# Patient Record
Sex: Male | Born: 1945
Health system: Southern US, Community
[De-identification: ages and names within clinical notes are randomized; demographics above are authoritative.]

## PROBLEM LIST (undated history)

## (undated) DIAGNOSIS — E039 Hypothyroidism, unspecified: Secondary | ICD-10-CM

## (undated) DIAGNOSIS — I10 Essential (primary) hypertension: Secondary | ICD-10-CM

## (undated) DIAGNOSIS — M1990 Unspecified osteoarthritis, unspecified site: Secondary | ICD-10-CM

## (undated) DIAGNOSIS — C801 Malignant (primary) neoplasm, unspecified: Secondary | ICD-10-CM

## (undated) DIAGNOSIS — E785 Hyperlipidemia, unspecified: Secondary | ICD-10-CM

## (undated) DIAGNOSIS — N200 Calculus of kidney: Secondary | ICD-10-CM

## (undated) HISTORY — DX: Hyperlipidemia, unspecified: E78.5

## (undated) HISTORY — PX: NO PAST SURGERIES: SHX2092

## (undated) HISTORY — DX: Calculus of kidney: N20.0

---

## 2007-06-16 ENCOUNTER — Other Ambulatory Visit: Payer: Self-pay

## 2007-06-16 ENCOUNTER — Emergency Department: Payer: Self-pay | Admitting: Emergency Medicine

## 2008-07-02 DIAGNOSIS — Z23 Encounter for immunization: Secondary | ICD-10-CM | POA: Insufficient documentation

## 2010-11-22 LAB — HM COLONOSCOPY

## 2011-12-13 DIAGNOSIS — Z85820 Personal history of malignant melanoma of skin: Secondary | ICD-10-CM | POA: Diagnosis not present

## 2012-05-14 DIAGNOSIS — L821 Other seborrheic keratosis: Secondary | ICD-10-CM | POA: Diagnosis not present

## 2012-05-14 DIAGNOSIS — Z85820 Personal history of malignant melanoma of skin: Secondary | ICD-10-CM | POA: Diagnosis not present

## 2012-06-16 DIAGNOSIS — H524 Presbyopia: Secondary | ICD-10-CM | POA: Diagnosis not present

## 2012-06-16 DIAGNOSIS — I10 Essential (primary) hypertension: Secondary | ICD-10-CM | POA: Diagnosis not present

## 2012-06-16 DIAGNOSIS — H5200 Hypermetropia, unspecified eye: Secondary | ICD-10-CM | POA: Diagnosis not present

## 2012-06-16 DIAGNOSIS — H43819 Vitreous degeneration, unspecified eye: Secondary | ICD-10-CM | POA: Diagnosis not present

## 2012-07-24 DIAGNOSIS — H5200 Hypermetropia, unspecified eye: Secondary | ICD-10-CM | POA: Diagnosis not present

## 2012-07-24 DIAGNOSIS — H524 Presbyopia: Secondary | ICD-10-CM | POA: Diagnosis not present

## 2012-07-24 DIAGNOSIS — H43819 Vitreous degeneration, unspecified eye: Secondary | ICD-10-CM | POA: Diagnosis not present

## 2012-07-24 DIAGNOSIS — E7800 Pure hypercholesterolemia, unspecified: Secondary | ICD-10-CM | POA: Diagnosis not present

## 2012-08-05 DIAGNOSIS — M7040 Prepatellar bursitis, unspecified knee: Secondary | ICD-10-CM | POA: Diagnosis not present

## 2012-09-02 ENCOUNTER — Emergency Department: Payer: Self-pay | Admitting: Emergency Medicine

## 2012-09-02 DIAGNOSIS — Z79899 Other long term (current) drug therapy: Secondary | ICD-10-CM | POA: Diagnosis not present

## 2012-09-02 DIAGNOSIS — S60219A Contusion of unspecified wrist, initial encounter: Secondary | ICD-10-CM | POA: Diagnosis not present

## 2012-09-02 DIAGNOSIS — S63509A Unspecified sprain of unspecified wrist, initial encounter: Secondary | ICD-10-CM | POA: Diagnosis not present

## 2012-09-02 DIAGNOSIS — I10 Essential (primary) hypertension: Secondary | ICD-10-CM | POA: Diagnosis not present

## 2012-09-02 DIAGNOSIS — T148XXA Other injury of unspecified body region, initial encounter: Secondary | ICD-10-CM | POA: Diagnosis not present

## 2012-09-02 DIAGNOSIS — M7989 Other specified soft tissue disorders: Secondary | ICD-10-CM | POA: Diagnosis not present

## 2012-09-02 DIAGNOSIS — M79609 Pain in unspecified limb: Secondary | ICD-10-CM | POA: Diagnosis not present

## 2012-09-12 DIAGNOSIS — Z85820 Personal history of malignant melanoma of skin: Secondary | ICD-10-CM | POA: Diagnosis not present

## 2012-09-12 DIAGNOSIS — D235 Other benign neoplasm of skin of trunk: Secondary | ICD-10-CM | POA: Diagnosis not present

## 2012-09-12 DIAGNOSIS — L821 Other seborrheic keratosis: Secondary | ICD-10-CM | POA: Diagnosis not present

## 2012-09-16 DIAGNOSIS — M7040 Prepatellar bursitis, unspecified knee: Secondary | ICD-10-CM | POA: Diagnosis not present

## 2012-09-17 DIAGNOSIS — T1500XA Foreign body in cornea, unspecified eye, initial encounter: Secondary | ICD-10-CM | POA: Diagnosis not present

## 2012-09-17 DIAGNOSIS — H5200 Hypermetropia, unspecified eye: Secondary | ICD-10-CM | POA: Diagnosis not present

## 2012-09-17 DIAGNOSIS — H16109 Unspecified superficial keratitis, unspecified eye: Secondary | ICD-10-CM | POA: Diagnosis not present

## 2012-09-17 DIAGNOSIS — H524 Presbyopia: Secondary | ICD-10-CM | POA: Diagnosis not present

## 2012-09-19 DIAGNOSIS — T1500XA Foreign body in cornea, unspecified eye, initial encounter: Secondary | ICD-10-CM | POA: Diagnosis not present

## 2012-09-19 DIAGNOSIS — H524 Presbyopia: Secondary | ICD-10-CM | POA: Diagnosis not present

## 2012-09-19 DIAGNOSIS — H5200 Hypermetropia, unspecified eye: Secondary | ICD-10-CM | POA: Diagnosis not present

## 2012-09-19 DIAGNOSIS — E7800 Pure hypercholesterolemia, unspecified: Secondary | ICD-10-CM | POA: Diagnosis not present

## 2012-09-19 DIAGNOSIS — Z23 Encounter for immunization: Secondary | ICD-10-CM | POA: Diagnosis not present

## 2012-11-17 DIAGNOSIS — L299 Pruritus, unspecified: Secondary | ICD-10-CM | POA: Diagnosis not present

## 2012-11-17 DIAGNOSIS — T148 Other injury of unspecified body region: Secondary | ICD-10-CM | POA: Diagnosis not present

## 2012-11-17 DIAGNOSIS — R229 Localized swelling, mass and lump, unspecified: Secondary | ICD-10-CM | POA: Diagnosis not present

## 2012-11-17 DIAGNOSIS — L03119 Cellulitis of unspecified part of limb: Secondary | ICD-10-CM | POA: Diagnosis not present

## 2012-11-17 DIAGNOSIS — L0390 Cellulitis, unspecified: Secondary | ICD-10-CM | POA: Diagnosis not present

## 2012-11-17 DIAGNOSIS — L0291 Cutaneous abscess, unspecified: Secondary | ICD-10-CM | POA: Diagnosis not present

## 2012-11-17 DIAGNOSIS — R21 Rash and other nonspecific skin eruption: Secondary | ICD-10-CM | POA: Diagnosis not present

## 2012-11-17 DIAGNOSIS — IMO0001 Reserved for inherently not codable concepts without codable children: Secondary | ICD-10-CM | POA: Diagnosis not present

## 2012-12-11 DIAGNOSIS — E785 Hyperlipidemia, unspecified: Secondary | ICD-10-CM | POA: Diagnosis not present

## 2012-12-11 DIAGNOSIS — I10 Essential (primary) hypertension: Secondary | ICD-10-CM | POA: Diagnosis not present

## 2012-12-11 DIAGNOSIS — Z1159 Encounter for screening for other viral diseases: Secondary | ICD-10-CM | POA: Diagnosis not present

## 2012-12-11 DIAGNOSIS — Z0000 Encounter for general adult medical examination without abnormal findings: Secondary | ICD-10-CM | POA: Diagnosis not present

## 2013-01-22 IMAGING — US US EXTREM UP VENOUS*R*
1 series · 14 of 24 positions shown · non-contrast
Comparison: none

REASON FOR EXAM: swelling and discoloration in right upper extremity
COMMENTS:

[Series 1: us extrem up venous*right* · 0.09mm/px · 14 of 47 slices shown]
[im 1/47]
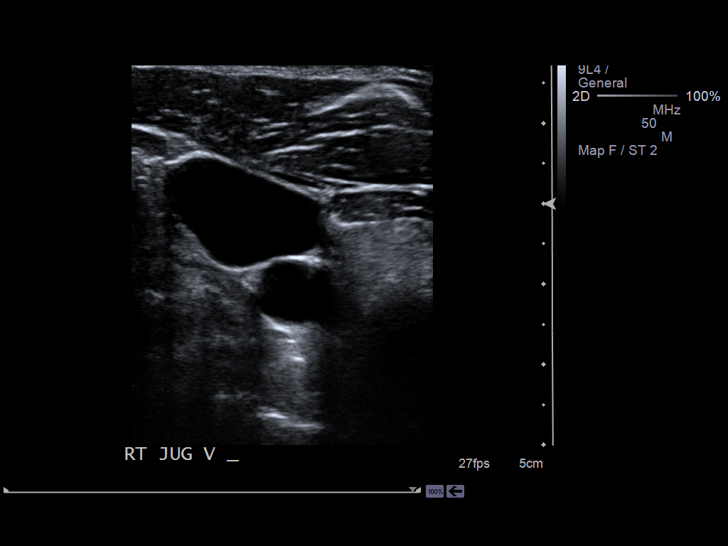
[im 5/47]
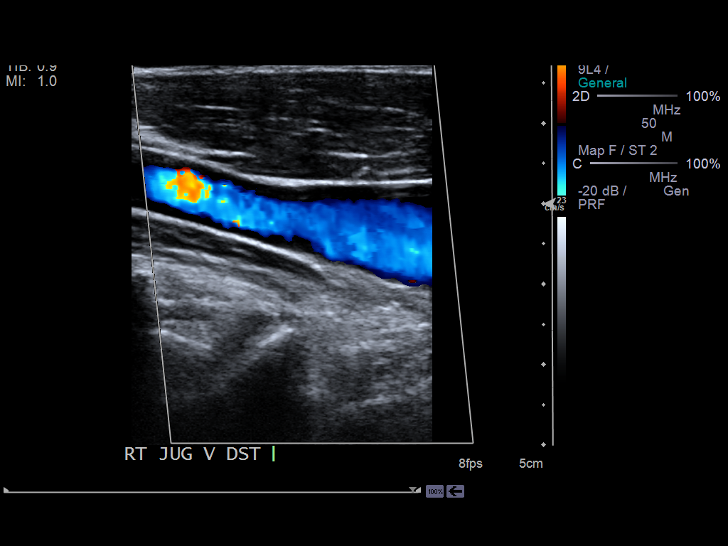
[im 9/47]
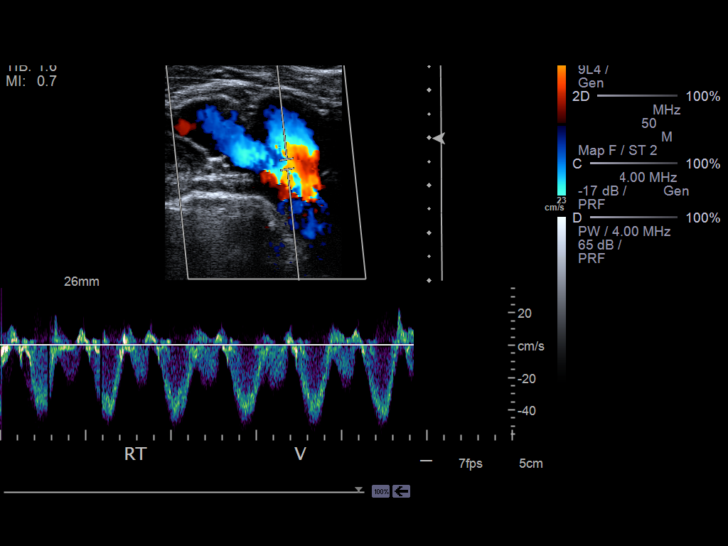
[im 13/47]
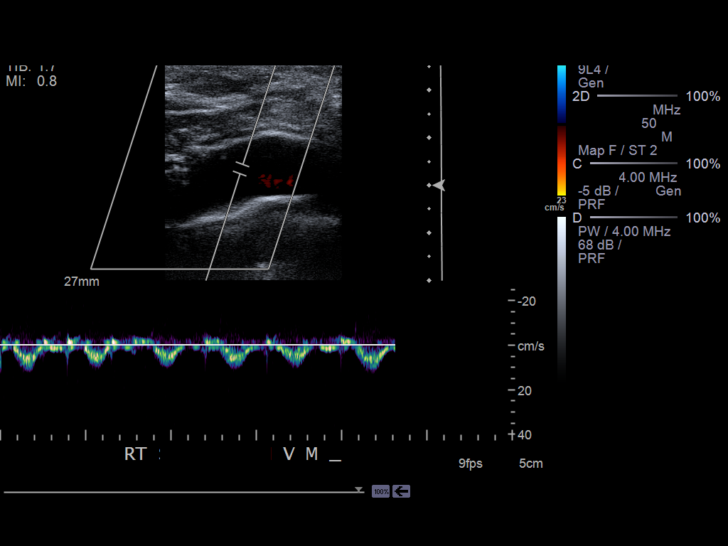
[im 15/47]
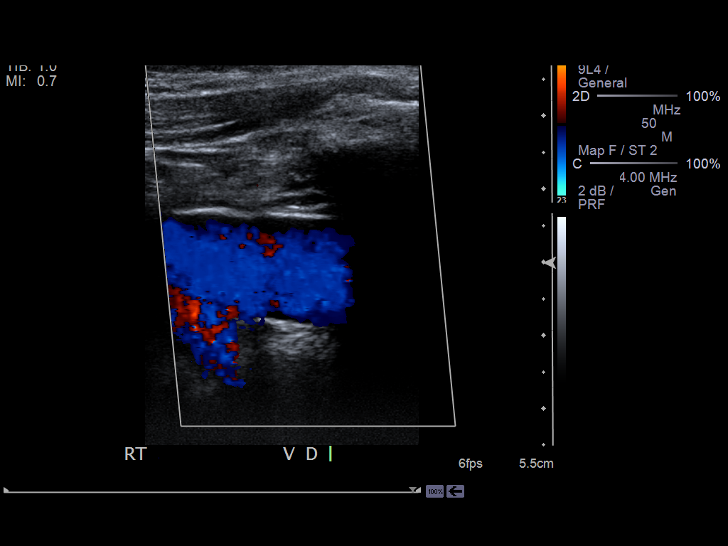
[im 19/47]
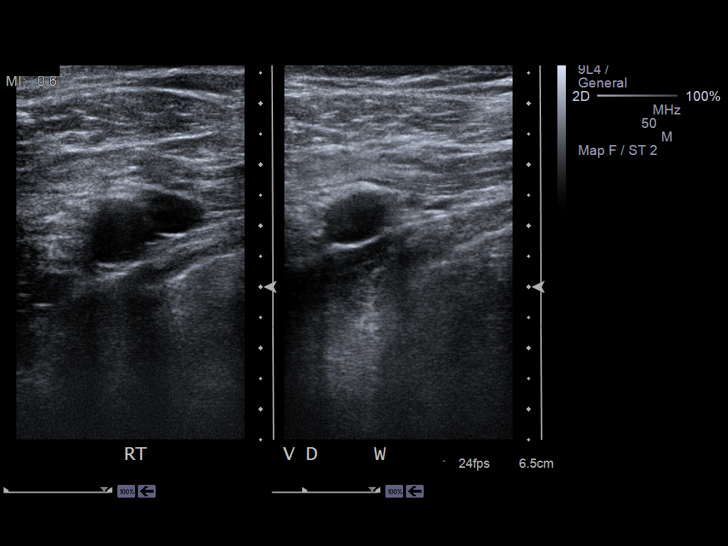
[im 23/47]
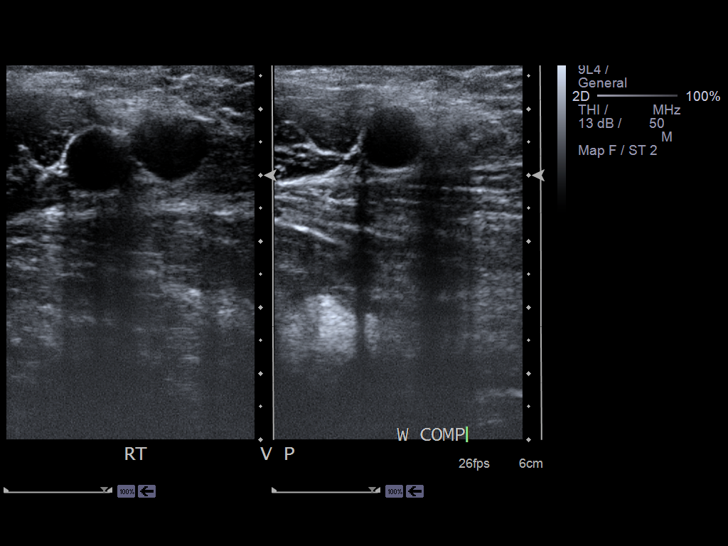
[im 25/47]
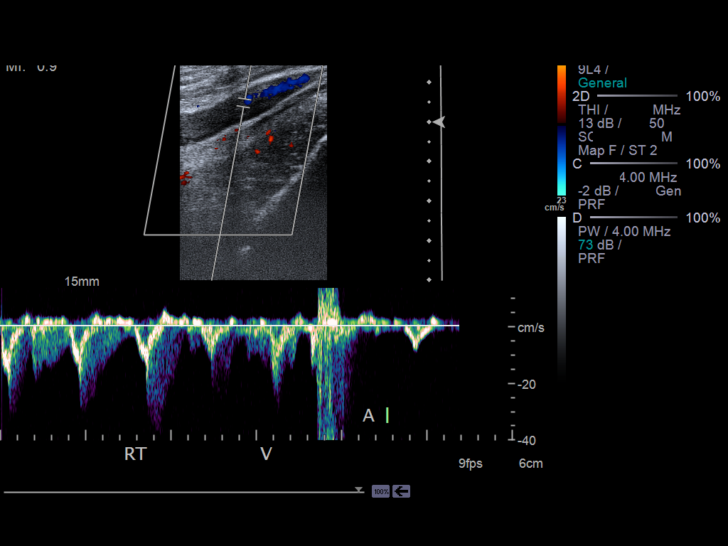
[im 29/47]
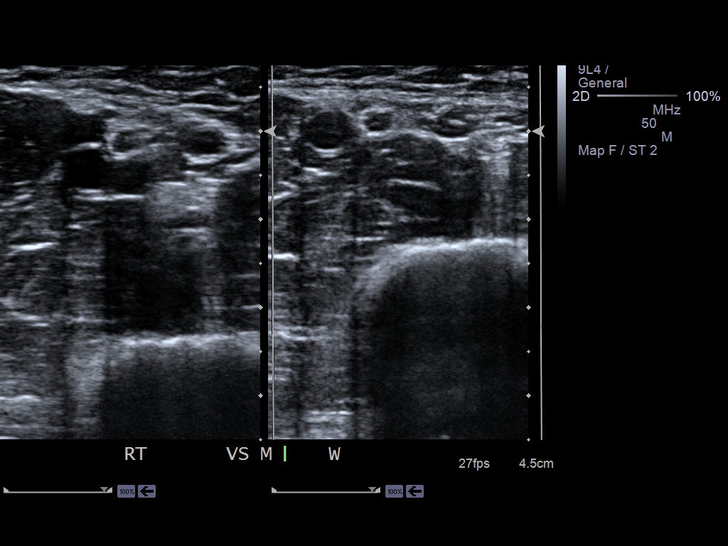
[im 33/47]
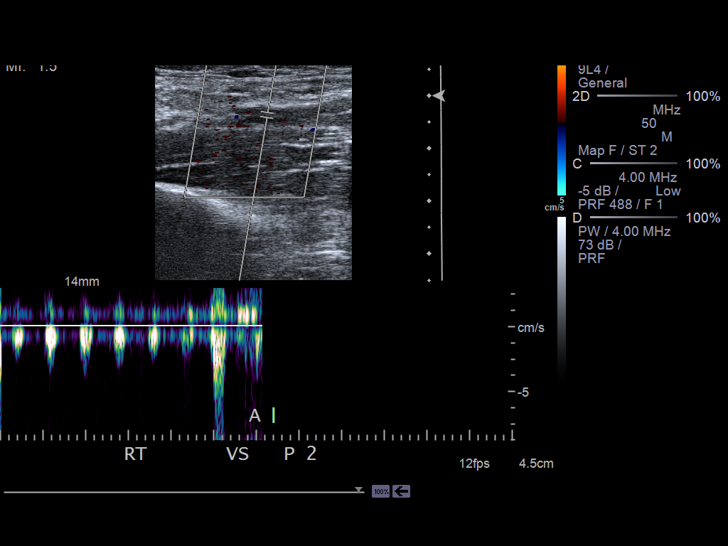
[im 37/47]
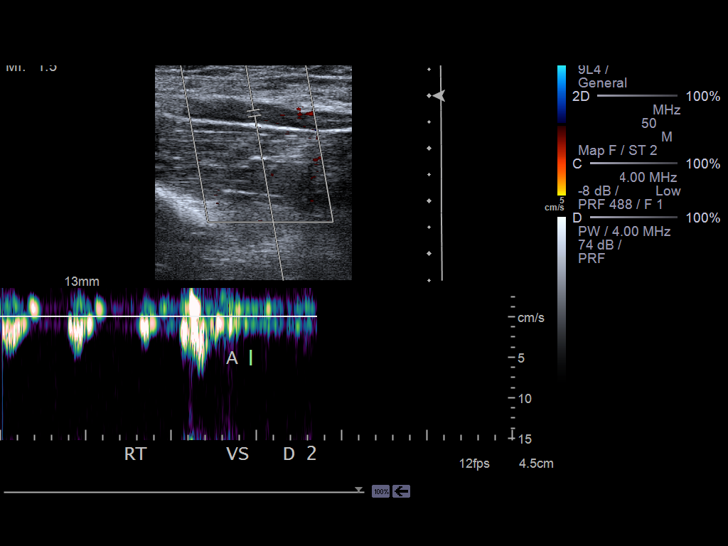
[im 39/47]
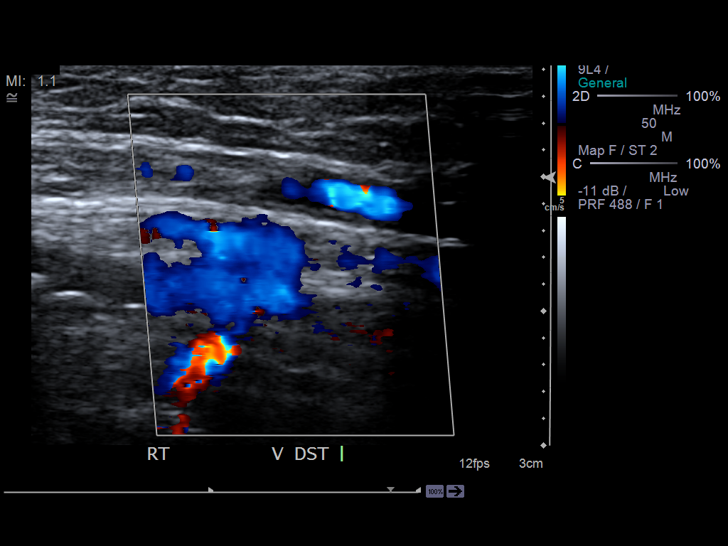
[im 43/47]
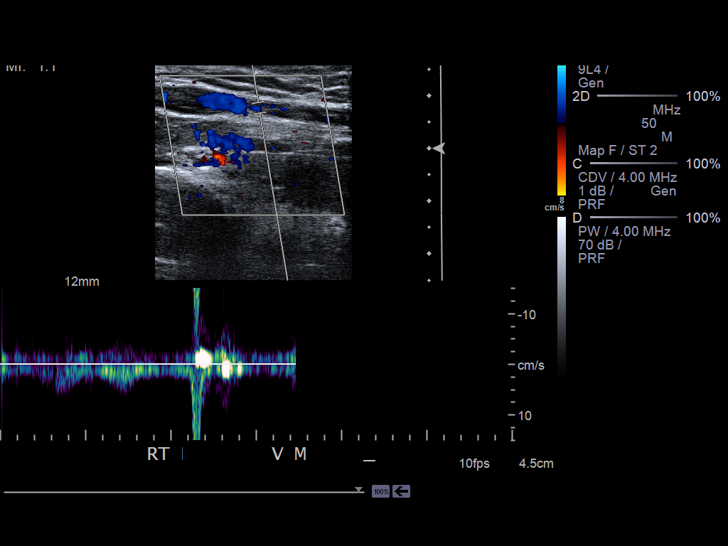
[im 47/47]
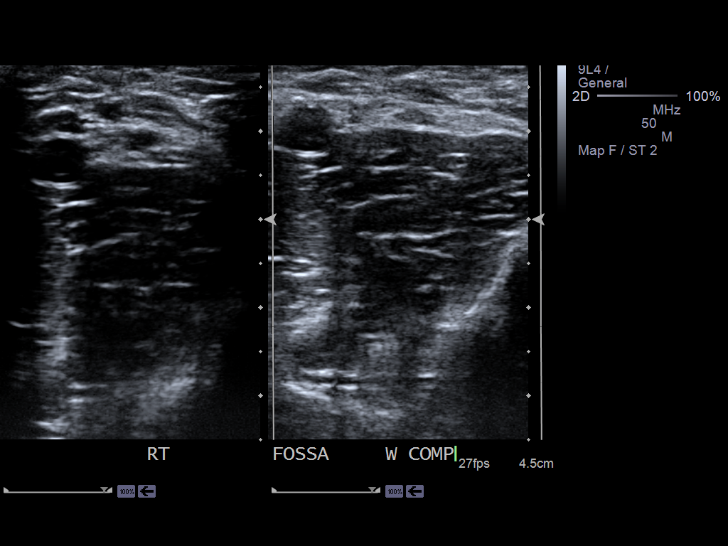

[14 of 24 positions shown; findings below may reference images not displayed]

PROCEDURE:     US  - US DOPPLER UP EXTR RIGHT  - September 02, 2012  [DATE]

RESULT:     Technique: Gray scale, Duplex color flow and SPECTRAL waveform
imaging was performed of the deep venous structures of the right upper
extremity.

There is no evidence of increased echogenicity, non- compressibility,
abnormal waveform or abnormal color filling within the interrogated deep
venous structures of the RIGHT upper extremity. There is appropriate
response to manipulation within the interrogated vessels.
IMPRESSION: 1. No sonographic evidence of a deep venous thrombus within the interrogated
vessels of the RIGHT upper extremity.

## 2013-02-10 DIAGNOSIS — Z23 Encounter for immunization: Secondary | ICD-10-CM | POA: Diagnosis not present

## 2013-02-10 DIAGNOSIS — N4889 Other specified disorders of penis: Secondary | ICD-10-CM | POA: Diagnosis not present

## 2013-03-18 DIAGNOSIS — Z85820 Personal history of malignant melanoma of skin: Secondary | ICD-10-CM | POA: Diagnosis not present

## 2013-03-18 DIAGNOSIS — D235 Other benign neoplasm of skin of trunk: Secondary | ICD-10-CM | POA: Diagnosis not present

## 2013-03-18 DIAGNOSIS — L570 Actinic keratosis: Secondary | ICD-10-CM | POA: Diagnosis not present

## 2013-06-15 DIAGNOSIS — M7040 Prepatellar bursitis, unspecified knee: Secondary | ICD-10-CM | POA: Diagnosis not present

## 2013-06-15 DIAGNOSIS — M1710 Unilateral primary osteoarthritis, unspecified knee: Secondary | ICD-10-CM | POA: Diagnosis not present

## 2013-06-22 DIAGNOSIS — H524 Presbyopia: Secondary | ICD-10-CM | POA: Diagnosis not present

## 2013-06-22 DIAGNOSIS — H21309 Idiopathic cysts of iris, ciliary body or anterior chamber, unspecified eye: Secondary | ICD-10-CM | POA: Diagnosis not present

## 2013-06-22 DIAGNOSIS — H11159 Pinguecula, unspecified eye: Secondary | ICD-10-CM | POA: Diagnosis not present

## 2013-06-22 DIAGNOSIS — H5200 Hypermetropia, unspecified eye: Secondary | ICD-10-CM | POA: Diagnosis not present

## 2013-09-17 DIAGNOSIS — L821 Other seborrheic keratosis: Secondary | ICD-10-CM | POA: Diagnosis not present

## 2013-09-17 DIAGNOSIS — D235 Other benign neoplasm of skin of trunk: Secondary | ICD-10-CM | POA: Diagnosis not present

## 2013-09-17 DIAGNOSIS — Z85820 Personal history of malignant melanoma of skin: Secondary | ICD-10-CM | POA: Diagnosis not present

## 2013-12-10 DIAGNOSIS — H43399 Other vitreous opacities, unspecified eye: Secondary | ICD-10-CM | POA: Diagnosis not present

## 2013-12-14 DIAGNOSIS — I10 Essential (primary) hypertension: Secondary | ICD-10-CM | POA: Diagnosis not present

## 2013-12-14 DIAGNOSIS — Z136 Encounter for screening for cardiovascular disorders: Secondary | ICD-10-CM | POA: Diagnosis not present

## 2013-12-14 DIAGNOSIS — Z125 Encounter for screening for malignant neoplasm of prostate: Secondary | ICD-10-CM | POA: Diagnosis not present

## 2013-12-14 DIAGNOSIS — Z0000 Encounter for general adult medical examination without abnormal findings: Secondary | ICD-10-CM | POA: Diagnosis not present

## 2014-06-17 DIAGNOSIS — D2272 Melanocytic nevi of left lower limb, including hip: Secondary | ICD-10-CM | POA: Diagnosis not present

## 2014-06-17 DIAGNOSIS — D2262 Melanocytic nevi of left upper limb, including shoulder: Secondary | ICD-10-CM | POA: Diagnosis not present

## 2014-06-17 DIAGNOSIS — L821 Other seborrheic keratosis: Secondary | ICD-10-CM | POA: Diagnosis not present

## 2014-06-17 DIAGNOSIS — D2261 Melanocytic nevi of right upper limb, including shoulder: Secondary | ICD-10-CM | POA: Diagnosis not present

## 2014-06-29 DIAGNOSIS — H524 Presbyopia: Secondary | ICD-10-CM | POA: Diagnosis not present

## 2014-06-29 DIAGNOSIS — H5203 Hypermetropia, bilateral: Secondary | ICD-10-CM | POA: Diagnosis not present

## 2014-06-29 DIAGNOSIS — H25093 Other age-related incipient cataract, bilateral: Secondary | ICD-10-CM | POA: Diagnosis not present

## 2014-06-29 DIAGNOSIS — H52221 Regular astigmatism, right eye: Secondary | ICD-10-CM | POA: Diagnosis not present

## 2014-12-17 DIAGNOSIS — Z0000 Encounter for general adult medical examination without abnormal findings: Secondary | ICD-10-CM | POA: Diagnosis not present

## 2014-12-17 DIAGNOSIS — Z125 Encounter for screening for malignant neoplasm of prostate: Secondary | ICD-10-CM | POA: Diagnosis not present

## 2014-12-17 DIAGNOSIS — Z1389 Encounter for screening for other disorder: Secondary | ICD-10-CM | POA: Diagnosis not present

## 2015-01-04 DIAGNOSIS — I10 Essential (primary) hypertension: Secondary | ICD-10-CM | POA: Diagnosis not present

## 2015-01-04 DIAGNOSIS — Z125 Encounter for screening for malignant neoplasm of prostate: Secondary | ICD-10-CM | POA: Diagnosis not present

## 2015-01-04 LAB — HEPATIC FUNCTION PANEL
ALT: 17 U/L (ref 10–40)
AST: 24 U/L (ref 14–40)
Alkaline Phosphatase: 53 U/L (ref 25–125)
BILIRUBIN, TOTAL: 0.4 mg/dL

## 2015-01-04 LAB — LIPID PANEL
Cholesterol: 171 mg/dL (ref 0–200)
HDL: 42 mg/dL (ref 35–70)
LDL Cholesterol: 115 mg/dL
TRIGLYCERIDES: 72 mg/dL (ref 40–160)

## 2015-01-04 LAB — CBC AND DIFFERENTIAL
HCT: 40 % — AB (ref 41–53)
Hemoglobin: 13.3 g/dL — AB (ref 13.5–17.5)
Neutrophils Absolute: 2 /uL
Platelets: 281 10*3/uL (ref 150–399)
WBC: 4.6 10^3/mL

## 2015-01-04 LAB — BASIC METABOLIC PANEL
BUN: 15 mg/dL (ref 4–21)
CREATININE: 1 mg/dL (ref 0.6–1.3)
Glucose: 93 mg/dL
POTASSIUM: 4 mmol/L (ref 3.4–5.3)
SODIUM: 140 mmol/L (ref 137–147)

## 2015-01-04 LAB — PSA: PSA: 1.9

## 2015-06-16 DIAGNOSIS — Z85820 Personal history of malignant melanoma of skin: Secondary | ICD-10-CM | POA: Diagnosis not present

## 2015-06-16 DIAGNOSIS — D485 Neoplasm of uncertain behavior of skin: Secondary | ICD-10-CM | POA: Diagnosis not present

## 2015-06-16 DIAGNOSIS — D2262 Melanocytic nevi of left upper limb, including shoulder: Secondary | ICD-10-CM | POA: Diagnosis not present

## 2015-06-16 DIAGNOSIS — D2271 Melanocytic nevi of right lower limb, including hip: Secondary | ICD-10-CM | POA: Diagnosis not present

## 2015-06-16 DIAGNOSIS — X32XXXA Exposure to sunlight, initial encounter: Secondary | ICD-10-CM | POA: Diagnosis not present

## 2015-06-16 DIAGNOSIS — L570 Actinic keratosis: Secondary | ICD-10-CM | POA: Diagnosis not present

## 2015-08-09 DIAGNOSIS — Z23 Encounter for immunization: Secondary | ICD-10-CM | POA: Diagnosis not present

## 2015-09-07 DIAGNOSIS — M1711 Unilateral primary osteoarthritis, right knee: Secondary | ICD-10-CM | POA: Diagnosis not present

## 2015-11-03 ENCOUNTER — Telehealth: Payer: Self-pay | Admitting: Family Medicine

## 2015-11-03 MED ORDER — AMLODIPINE BESYLATE 5 MG PO TABS: 5.0000 mg | ORAL_TABLET | Freq: Every day | ORAL | Status: DC

## 2015-11-03 NOTE — Telephone Encounter (Signed)
Pt contacted office for refill request on the following medications: Amlodipine Besylate 5 mg to [facility] [address]  Pt is scheduled for CPE on 12/19/15 and last OV was CPE on 12/17/14. Thanks TNP

## 2015-11-03 NOTE — Telephone Encounter (Signed)
Refill sent to [facility]. [employer].

## 2015-11-03 NOTE — Telephone Encounter (Signed)
Please review. Thanks!  

## 2015-11-23 DIAGNOSIS — Z2821 Immunization not carried out because of patient refusal: Secondary | ICD-10-CM | POA: Insufficient documentation

## 2015-11-23 DIAGNOSIS — N489 Disorder of penis, unspecified: Secondary | ICD-10-CM | POA: Insufficient documentation

## 2015-12-19 ENCOUNTER — Ambulatory Visit (INDEPENDENT_AMBULATORY_CARE_PROVIDER_SITE_OTHER): Payer: Medicare Other | Admitting: Family Medicine

## 2015-12-19 ENCOUNTER — Encounter: Payer: Self-pay | Admitting: Family Medicine

## 2015-12-19 VITALS — BP 118/80 | HR 62 | Temp 97.8°F | Resp 14 | Ht 69.5 in | Wt 177.4 lb

## 2015-12-19 DIAGNOSIS — E785 Hyperlipidemia, unspecified: Secondary | ICD-10-CM | POA: Diagnosis not present

## 2015-12-19 DIAGNOSIS — N400 Enlarged prostate without lower urinary tract symptoms: Secondary | ICD-10-CM

## 2015-12-19 DIAGNOSIS — Z2821 Immunization not carried out because of patient refusal: Secondary | ICD-10-CM | POA: Diagnosis not present

## 2015-12-19 DIAGNOSIS — I10 Essential (primary) hypertension: Secondary | ICD-10-CM

## 2015-12-19 DIAGNOSIS — Z0000 Encounter for general adult medical examination without abnormal findings: Secondary | ICD-10-CM

## 2015-12-19 LAB — POCT URINALYSIS DIPSTICK
Bilirubin, UA: NEGATIVE
Blood, UA: NEGATIVE
GLUCOSE UA: NEGATIVE
KETONES UA: NEGATIVE
LEUKOCYTES UA: NEGATIVE
Nitrite, UA: NEGATIVE
PROTEIN UA: NEGATIVE
Spec Grav, UA: 1.015
Urobilinogen, UA: 0.2
pH, UA: 7

## 2015-12-19 NOTE — Progress Notes (Signed)
Patient ID: Joseph Whitaker, male   DOB: 22-Apr-1946, 70 y.o.   MRN: [id number] Patient: Joseph Whitaker, Male    DOB: February 04, 1946, 70 y.o.   MRN: [id number] Visit Date: 12/19/2015  Today's Provider: Vernie Murders, PA   Chief Complaint  Patient presents with  . Annual Exam   Subjective:    Annual wellness visit Joseph Whitaker is a 70 y.o. male who presents today for his Subsequent Annual Wellness Visit. He feels well. 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 He reports he is sleeping average 5-6 hours per night.  -----------------------------------------------------------  Tdap: 04/04/2009 Zoster: 07/02/2008 Prevnar: declined Pneumovax: declined Colonoscopy: 11/22/2010  Review of Systems  Constitutional: Negative.   HENT: Negative.   Eyes: Negative.   Respiratory: Negative.   Cardiovascular: Negative.   Gastrointestinal: Negative.   Endocrine: Negative.   Genitourinary: Negative.   Musculoskeletal: Negative.   Skin: Negative.   Allergic/Immunologic: Negative.   Neurological: Negative.   Hematological: Negative.   Psychiatric/Behavioral: Negative.     Social History   Social History  . Marital Status: Married    Spouse Name: N/A  . Number of Children: 4  . Years of Education: N/A   Occupational History  . [employer] History Main Topics  . Smoking status: Former Smoker    Quit date: 09/03/1985  . Smokeless tobacco: Not on file  . Alcohol Use: 0.6 oz/week    0 Standard drinks or equivalent, 1 Cans of beer per week     Comment: occasionally  . Drug Use: No  . Sexual Activity: Not on file   Other Topics Concern  . Not on file   Social History Narrative    Patient Active Problem List   Diagnosis Date Noted  . Lesion of penis 11/23/2015  . Pneumococcal vaccination declined 11/23/2015  . Cannot sleep 12/29/2009  . Excessive urination at night 07/02/2008  . Need for vaccination 07/02/2008  . CD (contact dermatitis) 01/19/2008  . Essential  (primary) hypertension 06/16/2007  . HLD (hyperlipidemia) 08/10/2003    Past Surgical History  Procedure Laterality Date  . No past surgeries      His family history includes Arthritis in his brother; Colon cancer in his paternal grandfather; Dementia in his father; Heart attack in his brother; Heart disease in his maternal grandfather and maternal grandmother; Hyperlipidemia in his brother, brother, and sister.    Previous Medications   AMLODIPINE (NORVASC) 5 MG TABLET    Take 1 tablet (5 mg total) by mouth daily.   MULTIPLE VITAMIN PO    Take by mouth.   RED YEAST RICE 600 MG TABS    Take by mouth.    Patient Care Team: Margo Common, PA as PCP - General (Family Medicine)     Objective:   Vitals: BP 118/80 mmHg  Pulse 62  Temp(Src) 97.8 F (36.6 C) (Oral)  Resp 14  Ht 5' 9.5" (1.765 m)  Wt 177 lb 6.4 oz (80.468 kg)  BMI 25.83 kg/m2  Physical Exam  Constitutional: He is oriented to person, place, and time. He appears well-developed and well-nourished.  HENT:  Head: Normocephalic and atraumatic.  Right Ear: External ear normal.  Left Ear: External ear normal.  Nose: Nose normal.  Mouth/Throat: Oropharynx is clear and moist.  Eyes: Conjunctivae and EOM are normal. Pupils are equal, round, and reactive to light. Right eye exhibits no discharge.  Neck: Normal range of motion. Neck supple. No tracheal deviation present. No thyromegaly present.  Cardiovascular:  Normal rate, regular rhythm, normal heart sounds and intact distal pulses.   No murmur heard. Pulmonary/Chest: Effort normal and breath sounds normal. No respiratory distress. He has no wheezes. He has no rales. He exhibits no tenderness.  Abdominal: Soft. He exhibits no distension and no mass. There is no tenderness. There is no rebound and no guarding.  Genitourinary: Penis normal. Guaiac negative stool.  Prostate enlarged 3+. No specific nodules.  Musculoskeletal: Normal range of motion. He exhibits no edema  or tenderness.  Slight crepitus in the right knee to test ROM. Followed by orthopedist - postponing surgical intervention.  Lymphadenopathy:    He has no cervical adenopathy.  Neurological: He is alert and oriented to person, place, and time. He has normal reflexes. No cranial nerve deficit. He exhibits normal muscle tone. Coordination normal.  Skin: Skin is warm and dry. No rash noted. No erythema.  Psychiatric: He has a normal mood and affect. His behavior is normal. Judgment and thought content normal.    Activities of Daily Living In your present state of health, do you have any difficulty performing the following activities: 12/19/2015  Hearing? N  Vision? N  Difficulty concentrating or making decisions? N  Walking or climbing stairs? N  Dressing or bathing? N  Doing errands, shopping? N    Fall Risk Assessment Fall Risk  12/19/2015  Falls in the past year? No     Depression Screen PHQ 2/9 Scores 12/19/2015  PHQ - 2 Score 0    Cognitive Testing - 6-CIT  Correct? Score   What year is it? yes 0 0 or 4  What month is it? yes 0 0 or 3  Memorize:    Joseph Whitaker,  43,  [address],  [place],      What time is it? (within 1 hour) yes 0 0 or 3  Count backwards from 20 yes 0 0, 2, or 4  Name the months of the year yes 0 0, 2, or 4  Repeat name & address above yes 0 0, 2, 4, 6, 8, or 10       TOTAL SCORE  0/28   Interpretation:  Normal  Normal (0-7) Abnormal (8-28)    Assessment & Plan:     Annual Wellness Visit  Reviewed patient's Family Medical History Reviewed and updated list of patient's medical providers Assessment of cognitive impairment was done Assessed patient's functional ability Established a written schedule for health screening [place] Completed and Reviewed  Exercise Activities and Dietary recommendations Goals    Very physically active working the farm.      Immunization History  Administered Date(s) Administered  . Tdap  04/04/2009  . Zoster 07/02/2008    Health Maintenance  Topic Date Due  . Hepatitis C Screening  [birth year]  . PNA vac Low Risk Adult (1 of 2 - PCV13) 12/22/2011  . INFLUENZA VACCINE  04/03/2016  . TETANUS/TDAP  04/05/2019  . COLONOSCOPY  11/21/2020  . ZOSTAVAX  Completed      Discussed health benefits of physical activity, and encouraged him to engage in regular exercise appropriate for his age and condition.    ------------------------------------------------------------------------------------------------------------ 1. Annual physical exam Stable and immunizations up to date except pneumococcal vaccination. No smoking in the past 30 years.  - POCT urinalysis dipstick  2. Essential (primary) hypertension Stable and well controlled. Tolerating Amlodipine 5 mg qd without side effects. Will check routine labs and follow up pending results. - CBC with Differential/Platelet - Comprehensive  metabolic panel  3. HLD (hyperlipidemia) Continues to try to follow low fat diet and exercising routinely working the farm. Will recheck Lipids and thyroid.  - Lipid panel - TSH  4. Pneumococcal vaccination declined   5. Enlarged prostate Slight slow down of urine flow. Gets up maybe 1-2 times a night. Family history positive for prostate disease in his father. Will recheck PSA. Urinalysis essentially normal today. - PSA

## 2015-12-20 ENCOUNTER — Telehealth: Payer: Self-pay

## 2015-12-20 LAB — CBC WITH DIFFERENTIAL/PLATELET
BASOS ABS: 0 10*3/uL (ref 0.0–0.2)
BASOS: 0 %
EOS (ABSOLUTE): 0.1 10*3/uL (ref 0.0–0.4)
Eos: 1 %
Hematocrit: 38.5 % (ref 37.5–51.0)
Hemoglobin: 13.2 g/dL (ref 12.6–17.7)
IMMATURE GRANULOCYTES: 0 %
Immature Grans (Abs): 0 10*3/uL (ref 0.0–0.1)
LYMPHS: 38 %
Lymphocytes Absolute: 1.6 10*3/uL (ref 0.7–3.1)
MCH: 31.4 pg (ref 26.6–33.0)
MCHC: 34.3 g/dL (ref 31.5–35.7)
MCV: 92 fL (ref 79–97)
MONOS ABS: 0.5 10*3/uL (ref 0.1–0.9)
Monocytes: 13 %
NEUTROS ABS: 2 10*3/uL (ref 1.4–7.0)
NEUTROS PCT: 48 %
PLATELETS: 295 10*3/uL (ref 150–379)
RBC: 4.2 x10E6/uL (ref 4.14–5.80)
RDW: 13.8 % (ref 12.3–15.4)
WBC: 4.2 10*3/uL (ref 3.4–10.8)

## 2015-12-20 LAB — COMPREHENSIVE METABOLIC PANEL
A/G RATIO: 1.6 (ref 1.2–2.2)
ALT: 14 IU/L (ref 0–44)
AST: 21 IU/L (ref 0–40)
Albumin: 4.2 g/dL (ref 3.6–4.8)
Alkaline Phosphatase: 58 IU/L (ref 39–117)
BILIRUBIN TOTAL: 0.5 mg/dL (ref 0.0–1.2)
BUN/Creatinine Ratio: 18 (ref 10–24)
BUN: 18 mg/dL (ref 8–27)
CHLORIDE: 104 mmol/L (ref 96–106)
CO2: 24 mmol/L (ref 18–29)
Calcium: 9.6 mg/dL (ref 8.6–10.2)
Creatinine, Ser: 0.98 mg/dL (ref 0.76–1.27)
GFR calc Af Amer: 91 mL/min/{1.73_m2} (ref >59)
GFR calc non Af Amer: 79 mL/min/{1.73_m2} (ref >59)
GLUCOSE: 94 mg/dL (ref 65–99)
Globulin, Total: 2.7 g/dL (ref 1.5–4.5)
POTASSIUM: 4.8 mmol/L (ref 3.5–5.2)
Sodium: 141 mmol/L (ref 134–144)
Total Protein: 6.9 g/dL (ref 6.0–8.5)

## 2015-12-20 LAB — LIPID PANEL
CHOLESTEROL TOTAL: 180 mg/dL (ref 100–199)
Chol/HDL Ratio: 3.9 ratio units (ref 0.0–5.0)
HDL: 46 mg/dL (ref >39)
LDL Calculated: 119 mg/dL — ABNORMAL HIGH (ref 0–99)
TRIGLYCERIDES: 77 mg/dL (ref 0–149)
VLDL Cholesterol Cal: 15 mg/dL (ref 5–40)

## 2015-12-20 LAB — TSH: TSH: 6.04 u[IU]/mL — ABNORMAL HIGH (ref 0.450–4.500)

## 2015-12-20 LAB — PSA: PROSTATE SPECIFIC AG, SERUM: 2.3 ng/mL (ref 0.0–4.0)

## 2015-12-20 NOTE — Telephone Encounter (Signed)
Left message to call back. Tests added to specimen.

## 2015-12-20 NOTE — Telephone Encounter (Signed)
-----   Message from Margo Common, [place] sent at 12/20/2015  1:30 PM EDT ----- All blood tests essentially normal except LDL cholesterol a little above goal of 100. Continue low fat diet, exercise and Red Yeast Rice. TSH level elevated, Need to ask lab to run T3 and T4 levels.

## 2015-12-21 NOTE — Telephone Encounter (Signed)
Patient advised as directed below. Patient verbalized understanding.  

## 2015-12-22 ENCOUNTER — Telehealth: Payer: Self-pay

## 2015-12-22 NOTE — Telephone Encounter (Signed)
-----   Message from Margo Common, [place] sent at 12/22/2015  9:19 AM EDT ----- T3 and T4 counts are normal. The elevation in TSH is probably a normal fluctuation. Should recheck these levels in 6 months to be sure no thyroid disease is starting.

## 2015-12-22 NOTE — Telephone Encounter (Signed)
Patient advised as directed below. Patient verbalized understanding.  

## 2016-01-11 LAB — T3: T3 TOTAL: 128 ng/dL (ref 71–180)

## 2016-01-11 LAB — SPECIMEN STATUS REPORT

## 2016-01-11 LAB — T4: T4, Total: 5.7 ug/dL (ref 4.5–12.0)

## 2016-01-12 ENCOUNTER — Telehealth: Payer: Self-pay

## 2016-01-12 NOTE — Telephone Encounter (Signed)
LMTCB

## 2016-01-12 NOTE — Telephone Encounter (Signed)
-----   Message from Margo Common, [place] sent at 01/12/2016  8:28 AM EDT ----- T3 and T4 counts are normal. The elevation in TSH is probably a normal fluctuation. Should recheck these levels in 6 months to be sure no thyroid disease is starting

## 2016-01-17 NOTE — Telephone Encounter (Signed)
Patient advised as directed below. Patient verbalized understanding.  

## 2016-01-25 DIAGNOSIS — M25561 Pain in right knee: Secondary | ICD-10-CM | POA: Diagnosis not present

## 2016-01-25 DIAGNOSIS — M1711 Unilateral primary osteoarthritis, right knee: Secondary | ICD-10-CM | POA: Diagnosis not present

## 2016-04-18 DIAGNOSIS — M25561 Pain in right knee: Secondary | ICD-10-CM | POA: Diagnosis not present

## 2016-04-18 DIAGNOSIS — M1711 Unilateral primary osteoarthritis, right knee: Secondary | ICD-10-CM | POA: Diagnosis not present

## 2016-06-11 ENCOUNTER — Telehealth: Payer: Self-pay | Admitting: Family Medicine

## 2016-06-11 DIAGNOSIS — M25561 Pain in right knee: Secondary | ICD-10-CM | POA: Diagnosis not present

## 2016-06-11 DIAGNOSIS — M1711 Unilateral primary osteoarthritis, right knee: Secondary | ICD-10-CM | POA: Diagnosis not present

## 2016-06-11 DIAGNOSIS — R7989 Other specified abnormal findings of blood chemistry: Secondary | ICD-10-CM

## 2016-06-11 NOTE — Telephone Encounter (Signed)
Will need T4, TSH and Free T3 to follow up diagnosis of elevated TSH.

## 2016-06-11 NOTE — Telephone Encounter (Signed)
Pt stated that he needed to have his labs done and he was advised at his CPE to call and request a lab slip. Pt request a call back when slip is ready for pick up. Please advise. Thanks TNP

## 2016-06-12 NOTE — Telephone Encounter (Signed)
Lab slip printed at front desk for pick up. Patient advised.  

## 2016-06-14 DIAGNOSIS — D2262 Melanocytic nevi of left upper limb, including shoulder: Secondary | ICD-10-CM | POA: Diagnosis not present

## 2016-06-14 DIAGNOSIS — D485 Neoplasm of uncertain behavior of skin: Secondary | ICD-10-CM | POA: Diagnosis not present

## 2016-06-14 DIAGNOSIS — Z85820 Personal history of malignant melanoma of skin: Secondary | ICD-10-CM | POA: Diagnosis not present

## 2016-06-14 DIAGNOSIS — L570 Actinic keratosis: Secondary | ICD-10-CM | POA: Diagnosis not present

## 2016-06-14 DIAGNOSIS — X32XXXA Exposure to sunlight, initial encounter: Secondary | ICD-10-CM | POA: Diagnosis not present

## 2016-06-14 DIAGNOSIS — R946 Abnormal results of thyroid function studies: Secondary | ICD-10-CM | POA: Diagnosis not present

## 2016-06-14 DIAGNOSIS — D2271 Melanocytic nevi of right lower limb, including hip: Secondary | ICD-10-CM | POA: Diagnosis not present

## 2016-06-15 LAB — T3, FREE: T3 FREE: 3 pg/mL (ref 2.0–4.4)

## 2016-06-15 LAB — T4: T4, Total: 5.4 ug/dL (ref 4.5–12.0)

## 2016-06-15 LAB — TSH: TSH: 5.2 u[IU]/mL — ABNORMAL HIGH (ref 0.450–4.500)

## 2016-06-18 ENCOUNTER — Telehealth: Payer: Self-pay

## 2016-06-18 NOTE — Telephone Encounter (Signed)
Patient advised.

## 2016-06-18 NOTE — Telephone Encounter (Signed)
-----   Message from Margo Common, [place] sent at 06/15/2016  1:53 PM EDT ----- Essentially normal thyroid function tests. TSH is going back toward normal. Recheck annually.

## 2016-08-06 ENCOUNTER — Ambulatory Visit (INDEPENDENT_AMBULATORY_CARE_PROVIDER_SITE_OTHER): Payer: Medicare Other | Admitting: Family Medicine

## 2016-08-06 DIAGNOSIS — Z23 Encounter for immunization: Secondary | ICD-10-CM

## 2016-08-13 ENCOUNTER — Encounter: Payer: Self-pay | Admitting: Family Medicine

## 2016-08-13 ENCOUNTER — Ambulatory Visit (INDEPENDENT_AMBULATORY_CARE_PROVIDER_SITE_OTHER): Payer: Medicare Other | Admitting: Family Medicine

## 2016-08-13 VITALS — BP 126/78 | HR 72 | Temp 98.4°F | Resp 14 | Wt 176.2 lb

## 2016-08-13 DIAGNOSIS — I10 Essential (primary) hypertension: Secondary | ICD-10-CM

## 2016-08-13 DIAGNOSIS — E7800 Pure hypercholesterolemia, unspecified: Secondary | ICD-10-CM | POA: Diagnosis not present

## 2016-08-13 DIAGNOSIS — M1711 Unilateral primary osteoarthritis, right knee: Secondary | ICD-10-CM

## 2016-08-13 DIAGNOSIS — Z01818 Encounter for other preprocedural examination: Secondary | ICD-10-CM

## 2016-08-13 DIAGNOSIS — R5383 Other fatigue: Secondary | ICD-10-CM | POA: Diagnosis not present

## 2016-08-13 LAB — EKG 12-LEAD: EKG 12 LEAD: NORMAL

## 2016-08-13 NOTE — Progress Notes (Signed)
Patient: Joseph Whitaker Male    DOB: 04/23/1946   70 y.o.   MRN: [id number] Visit Date: 08/13/2016  Today's Provider: Vernie Murders, PA   Chief Complaint  Patient presents with  . Medical Clearance   Subjective:    HPI Patient is here for surgical clearance. He is having right:TKA medial and lateral w/wo patella resurfacing surgery on 09/18/2016 by Dr Alvan Dame. No concerns about the surgery. He will be doing physical therapy after. He may have to stay overnight after the surgery. He does not smoke and occasionally drinks alcohol. No cardiac or neurologic symptoms at all.    Patient Active Problem List   Diagnosis Date Noted  . Lesion of penis 11/23/2015  . Pneumococcal vaccination declined 11/23/2015  . Cannot sleep 12/29/2009  . Excessive urination at night 07/02/2008  . Need for vaccination 07/02/2008  . CD (contact dermatitis) 01/19/2008  . Essential (primary) hypertension 06/16/2007  . HLD (hyperlipidemia) 08/10/2003   Past Surgical History:  Procedure Laterality Date  . NO PAST SURGERIES     Family History  Problem Relation Age of Onset  . Dementia Father   . Hyperlipidemia Sister   . Heart attack Brother   . Heart disease Maternal Grandmother   . Heart disease Maternal Grandfather   . Colon cancer Paternal Grandfather   . Arthritis Brother   . Hyperlipidemia Brother   . Hyperlipidemia Brother    No Known Allergies  Previous Medications   AMLODIPINE (NORVASC) 5 MG TABLET    Take 1 tablet (5 mg total) by mouth daily.   COENZYME Q10 (CO Q 10 PO)    Take by mouth.   MAGNESIUM PO    Take by mouth.   MELATONIN PO    Take by mouth.   MULTIPLE VITAMIN PO    Take by mouth.   RED YEAST RICE 600 MG TABS    Take by mouth.   TURMERIC 500 MG TABS    Take by mouth.    Review of Systems  Constitutional: Negative.   Respiratory: Negative.   Cardiovascular: Negative.   Musculoskeletal: Positive for arthralgias.    Social History  Substance Use Topics  . Smoking  status: Former Smoker    Quit date: 09/03/1985  . Smokeless tobacco: Not on file  . Alcohol use 0.6 oz/week    1 Cans of beer per week     Comment: occasionally   Objective:   BP 126/78 (BP Location: Right Arm, Patient Position: Sitting, Cuff Size: Normal)   Pulse 72   Temp 98.4 F (36.9 C) (Oral)   Resp 14   Wt 176 lb 3.2 oz (79.9 kg)   BMI 25.65 kg/m   Physical Exam  Constitutional: He is oriented to person, place, and time. He appears well-developed and well-nourished.  HENT:  Head: Normocephalic.  Right Ear: External ear normal.  Left Ear: External ear normal.  Nose: Nose normal.  Eyes: Conjunctivae are normal. Pupils are equal, round, and reactive to light.  Neck: Neck supple.  Cardiovascular: Normal rate and regular rhythm.   Pulmonary/Chest: Effort normal and breath sounds normal.  Abdominal: Soft. Bowel sounds are normal.  Musculoskeletal:  Enlargement of right knee with warmth to palpate and pain to test ROM.   Neurological: He is alert and oriented to person, place, and time. He has normal reflexes.  Psychiatric: He has a normal mood and affect. His behavior is normal.      Assessment & Plan:     1. Encounter  for pre-operative examination Good general health with normal EKG. Will get routine labs. Patient and wife worried about past tick exposure and possible Lyme's Disease. Will defer medical clearance for surgery until lab reports are back. - CBC with Differential/Platelet - Comprehensive metabolic panel - EKG [id number]  2. Primary osteoarthritis of right knee Planning total right knee arthroplasty on September 18, 2016 by Dr. Alvan Dame.  3. Essential (primary) hypertension Stable and well controlled BP. Tolerating Amlodipine 5 mg qd without side effects. No palpitations, chest pain, peripheral edema or dyspnea. Recheck routine labs and follow up pending reports. - CBC with Differential/Platelet - Comprehensive metabolic panel - TSH - Lipid panel - EKG  12-Lead  4. Pure hypercholesterolemia Trying to limit fat intake in diet and continues to take Red Yeast Rice qd. Recheck labs and follow pending reports. - Comprehensive metabolic panel - TSH - Lipid panel  5. 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 Also, more worried since daughter was diagnosed with Lyme's Disease this year. Will get CBC, CMP, TSH and Lyme tests. If negative, will give final clearance to proceed with knee surgery. - CBC with Differential/Platelet - Comprehensive metabolic panel - TSH - Lyme Ab/Western Blot Reflex

## 2016-08-14 ENCOUNTER — Telehealth: Payer: Self-pay

## 2016-08-14 DIAGNOSIS — M1711 Unilateral primary osteoarthritis, right knee: Secondary | ICD-10-CM | POA: Diagnosis not present

## 2016-08-14 LAB — CBC WITH DIFFERENTIAL/PLATELET
Basophils Absolute: 0 10*3/uL (ref 0.0–0.2)
Basos: 0 %
EOS (ABSOLUTE): 0 10*3/uL (ref 0.0–0.4)
EOS: 1 %
HEMATOCRIT: 39.9 % (ref 37.5–51.0)
HEMOGLOBIN: 13.5 g/dL (ref 13.0–17.7)
Immature Grans (Abs): 0 10*3/uL (ref 0.0–0.1)
Immature Granulocytes: 0 %
LYMPHS ABS: 1 10*3/uL (ref 0.7–3.1)
Lymphs: 23 %
MCH: 30.6 pg (ref 26.6–33.0)
MCHC: 33.8 g/dL (ref 31.5–35.7)
MCV: 91 fL (ref 79–97)
MONOCYTES: 11 %
Monocytes Absolute: 0.5 10*3/uL (ref 0.1–0.9)
NEUTROS ABS: 2.9 10*3/uL (ref 1.4–7.0)
Neutrophils: 65 %
Platelets: 310 10*3/uL (ref 150–379)
RBC: 4.41 x10E6/uL (ref 4.14–5.80)
RDW: 13.4 % (ref 12.3–15.4)
WBC: 4.4 10*3/uL (ref 3.4–10.8)

## 2016-08-14 LAB — LYME AB/WESTERN BLOT REFLEX

## 2016-08-14 LAB — LIPID PANEL
CHOL/HDL RATIO: 4.5 ratio (ref 0.0–5.0)
Cholesterol, Total: 179 mg/dL (ref 100–199)
HDL: 40 mg/dL (ref >39)
LDL CALC: 119 mg/dL — AB (ref 0–99)
Triglycerides: 100 mg/dL (ref 0–149)
VLDL CHOLESTEROL CAL: 20 mg/dL (ref 5–40)

## 2016-08-14 LAB — COMPREHENSIVE METABOLIC PANEL
ALBUMIN: 4.2 g/dL (ref 3.6–4.8)
ALK PHOS: 57 IU/L (ref 39–117)
ALT: 17 IU/L (ref 0–44)
AST: 20 IU/L (ref 0–40)
Albumin/Globulin Ratio: 1.6 (ref 1.2–2.2)
BUN / CREAT RATIO: 24 (ref 10–24)
BUN: 18 mg/dL (ref 8–27)
Bilirubin Total: 0.3 mg/dL (ref 0.0–1.2)
CO2: 24 mmol/L (ref 18–29)
CREATININE: 0.75 mg/dL — AB (ref 0.76–1.27)
Calcium: 9.5 mg/dL (ref 8.6–10.2)
Chloride: 105 mmol/L (ref 96–106)
GFR calc non Af Amer: 94 mL/min/{1.73_m2} (ref >59)
GFR, EST AFRICAN AMERICAN: 108 mL/min/{1.73_m2} (ref >59)
GLOBULIN, TOTAL: 2.6 g/dL (ref 1.5–4.5)
Glucose: 89 mg/dL (ref 65–99)
Potassium: 4.7 mmol/L (ref 3.5–5.2)
SODIUM: 143 mmol/L (ref 134–144)
Total Protein: 6.8 g/dL (ref 6.0–8.5)

## 2016-08-14 LAB — TSH: TSH: 6.43 u[IU]/mL — AB (ref 0.450–4.500)

## 2016-08-14 NOTE — Telephone Encounter (Signed)
Advised pt of lab results. Pt verbally acknowledges understanding. Shanora Christensen Drozdowski, CMA   

## 2016-08-14 NOTE — Telephone Encounter (Signed)
-----   Message from Margo Common, [place] sent at 08/14/2016  1:06 PM EST ----- Labs essentially normal with some elevation of TSH (borderline/subclinical hypothyroidism). Awaiting final reports of Lyme tests. Sent medical clearance to proceed with knee surgery.

## 2016-08-15 ENCOUNTER — Telehealth: Payer: Self-pay

## 2016-08-15 MED ORDER — LEVOTHYROXINE SODIUM 25 MCG PO TABS: 25.0000 ug | ORAL_TABLET | Freq: Every day | ORAL | 3 refills | Status: DC

## 2016-08-15 NOTE — Telephone Encounter (Signed)
[id number] 

## 2016-08-15 NOTE — Telephone Encounter (Signed)
-----   Message from Margo Common, [place] sent at 08/14/2016  4:45 PM EST ----- Lyme disease tests were negative. Consider Levothyroxine 25 mcg qd #30 & 3 RF for subclinical hypothyroidism then check thyroid tests in a month.

## 2016-08-15 NOTE — Telephone Encounter (Signed)
Patient was notified and prescription sent to [facility] [address]. KW

## 2016-09-07 ENCOUNTER — Telehealth: Payer: Self-pay

## 2016-09-07 DIAGNOSIS — E039 Hypothyroidism, unspecified: Secondary | ICD-10-CM

## 2016-09-07 DIAGNOSIS — M1711 Unilateral primary osteoarthritis, right knee: Secondary | ICD-10-CM | POA: Diagnosis not present

## 2016-09-07 NOTE — Telephone Encounter (Signed)
Labs ordered. Printed at front desk for pickup. Patient is aware.

## 2016-09-07 NOTE — Telephone Encounter (Signed)
Please review im on the phone-aa

## 2016-09-07 NOTE — H&P (Signed)
TOTAL KNEE ADMISSION H&P  Patient is being admitted for right total knee arthroplasty.  Subjective:  Chief Complaint:  Right knee primary OA / pain  HPI: TORAINO SCOPEL, 71 y.o. male, has a history of pain and functional disability in the right knee due to arthritis and has failed non-surgical conservative treatments for greater than 12 weeks to includeNSAID's and/or analgesics, corticosteriod injections and activity modification.  Onset of symptoms was gradual, starting 2+ years ago with gradually worsening course since that time. The patient noted no past surgery on the right knee(s).  Patient currently rates pain in the right knee(s) at 8 out of 10 with activity. Patient has worsening of pain with activity and weight bearing, pain that interferes with activities of daily living, pain with passive range of motion, crepitus and joint swelling.  Patient has evidence of periarticular osteophytes and joint space narrowing by imaging studies.  There is no active infection.  Risks, benefits and expectations were discussed with the patient.  Risks including but not limited to the risk of anesthesia, blood clots, nerve damage, blood vessel damage, failure of the prosthesis, infection and up to and including death.  Patient understand the risks, benefits and expectations and wishes to proceed with surgery.   PCP: Vernie Murders, PA  D/C Plans:       Home  Post-op Meds:       No Rx given  Tranexamic Acid:      To be given - IV   Decadron:      Is to be given  FYI:     ASA  Norco   PT - OPPT at [facility]   Patient Active Problem List   Diagnosis Date Noted  . Lesion of penis 11/23/2015  . Pneumococcal vaccination declined 11/23/2015  . Cannot sleep 12/29/2009  . Excessive urination at night 07/02/2008  . Need for vaccination 07/02/2008  . CD (contact dermatitis) 01/19/2008  . Essential (primary) hypertension 06/16/2007  . HLD (hyperlipidemia) 08/10/2003   Past Medical History:  Diagnosis Date   . Arthritis   . Hypertension   . Hypothyroidism      Past Surgical History:  Procedure Laterality Date  . NO PAST SURGERIES       No Known Allergies   Social History  Substance Use Topics  . Smoking status: Former Smoker    Quit date: 09/03/1985  . Smokeless tobacco: Never Used  . Alcohol use 0.6 oz/week    1 Cans of beer per week     Comment: occasionally    Family History  Problem Relation Age of Onset  . Dementia Father   . Hyperlipidemia Sister   . Heart attack Brother   . Heart disease Maternal Grandmother   . Heart disease Maternal Grandfather   . Colon cancer Paternal Grandfather   . Arthritis Brother   . Hyperlipidemia Brother   . Hyperlipidemia Brother      Review of Systems  Constitutional: Negative.   HENT: Negative.   Eyes: Negative.   Respiratory: Negative.   Cardiovascular: Negative.   Gastrointestinal: Negative.   Genitourinary: Positive for frequency.  Musculoskeletal: Positive for joint pain.  Skin: Negative.   Neurological: Negative.   Endo/Heme/Allergies: Negative.   Psychiatric/Behavioral: Negative.     Objective:  Physical Exam  Constitutional: He is oriented to person, place, and time. He appears well-developed.  HENT:  Head: Normocephalic.  Eyes: Pupils are equal, round, and reactive to light.  Neck: Neck supple. No JVD present. No tracheal deviation present.  No thyromegaly present.  Cardiovascular: Normal rate, regular rhythm, normal heart sounds and intact distal pulses.   Respiratory: Effort normal and breath sounds normal. No respiratory distress. He has no wheezes.  GI: Soft. There is no tenderness. There is no guarding.  Musculoskeletal:       Right knee: He exhibits decreased range of motion, swelling and bony tenderness. He exhibits no ecchymosis, no deformity, no laceration, no erythema and normal alignment. Tenderness found.  Lymphadenopathy:    He has no cervical adenopathy.  Neurological: He is alert and oriented to  person, place, and time.  Skin: Skin is warm and dry.  Psychiatric: He has a normal mood and affect.      Labs:  Estimated body mass index is 26.85 kg/m as calculated from the following:   Height as of 09/11/16: 5\' 8"  (1.727 m).   Weight as of 09/11/16: 80.1 kg (176 lb 9.6 oz).   Imaging Review Plain radiographs demonstrate severe degenerative joint disease of the right knee(s). The overall alignment isneutral. The bone quality appears to be good for age and reported activity level.  Assessment/Plan:  End stage arthritis, right knee   The patient history, physical examination, clinical judgment of the provider and imaging studies are consistent with end stage degenerative joint disease of the right knee(s) and total knee arthroplasty is deemed medically necessary. The treatment options including medical management, injection therapy arthroscopy and arthroplasty were discussed at length. The risks and benefits of total knee arthroplasty were presented and reviewed. The risks due to aseptic loosening, infection, stiffness, patella tracking problems, thromboembolic complications and other imponderables were discussed. The patient acknowledged the explanation, agreed to proceed with the plan and consent was signed. Patient is being admitted for inpatient treatment for surgery, pain control, PT, OT, prophylactic antibiotics, VTE prophylaxis, progressive ambulation and ADL's and discharge planning. The patient is planning to be discharged home.     [place] Pugh Leyna Vanderkolk   PA-C  09/11/2016, 9:37 AM

## 2016-09-07 NOTE — Telephone Encounter (Signed)
Schedule for fasting labs to recheck hypothyroidism with TSH, T4 and T3 uptake next week.

## 2016-09-07 NOTE — Telephone Encounter (Signed)
Patient called and states he was put on Levothyroxine in December and thinks he is due for a re check. He is able to come and get this done next week, week after next patient will be having surgery and would like to get this done before. Please let pt know, phone numbers are correct in the chart=aa

## 2016-09-10 NOTE — Patient Instructions (Addendum)
Joseph Whitaker  09/10/2016   Your procedure is scheduled on: Tuesday 09/18/2016  Report to [facility] Main  Entrance take [facility]  elevators to 3rd floor to  [place] at  0600 AM.  Call this number if you have problems the morning of surgery [phone]   Remember: ONLY 1 PERSON MAY GO WITH YOU TO SHORT STAY TO GET  READY MORNING OF [place].   Do not eat food or drink liquids :After Midnight.     Take these medicines the morning of surgery with A SIP OF WATER: Amlodipine, Levothyroxine (take your Levothyroxine only if your PCP gives you a refill )                               You may not have any metal on your body including hair pins and              piercings  Do not wear jewelry, make-up, lotions, powders or perfumes, deodorant             Do not wear nail polish.  Do not shave  48 hours prior to surgery.              Men may shave face and neck.   Do not bring valuables to the hospital. [place].  Contacts, dentures or bridgework may not be worn into surgery.  Leave suitcase in the car. After surgery it may be brought to your room.                  Please read over the following fact sheets you were given: _____________________________________________________________________             [facility] - Preparing for Surgery Before surgery, you can play an important role.  Because skin is not sterile, your skin needs to be as free of germs as possible.  You can reduce the number of germs on your skin by washing with CHG (chlorahexidine gluconate) soap before surgery.  CHG is an antiseptic cleaner which kills germs and bonds with the skin to continue killing germs even after washing. Please DO NOT use if you have an allergy to CHG or antibacterial soaps.  If your skin becomes reddened/irritated stop using the CHG and inform your nurse when you arrive at Short Stay. Do not shave (including  legs and underarms) for at least 48 hours prior to the first CHG shower.  You may shave your face/neck. Please follow these instructions carefully:  1.  Shower with CHG Soap the night before surgery and the  morning of Surgery.  2.  If you choose to wash your hair, wash your hair first as usual with your  normal  shampoo.  3.  After you shampoo, rinse your hair and body thoroughly to remove the  shampoo.                           4.  Use CHG as you would any other liquid soap.  You can apply chg directly  to the skin and wash  Gently with a scrungie or clean washcloth.  5.  Apply the CHG Soap to your body ONLY FROM THE NECK DOWN.   Do not use on face/ open                           Wound or open sores. Avoid contact with eyes, ears mouth and genitals (private parts).                       Wash face,  Genitals (private parts) with your normal soap.             6.  Wash thoroughly, paying special attention to the area where your surgery  will be performed.  7.  Thoroughly rinse your body with warm water from the neck down.  8.  DO NOT shower/wash with your normal soap after using and rinsing off  the CHG Soap.                9.  Pat yourself dry with a clean towel.            10.  Wear clean pajamas.            11.  Place clean sheets on your bed the night of your first shower and do not  sleep with pets. Day of Surgery : Do not apply any lotions/deodorants the morning of surgery.  Please wear clean clothes to the hospital/surgery center.  FAILURE TO FOLLOW THESE INSTRUCTIONS MAY RESULT IN THE CANCELLATION OF YOUR SURGERY PATIENT SIGNATURE_________________________________  NURSE SIGNATURE__________________________________  ________________________________________________________________________   Adam Phenix  An incentive spirometer is a tool that can help keep your lungs clear and active. This tool measures how well you are filling your lungs with each breath.  Taking long deep breaths may help reverse or decrease the chance of developing breathing (pulmonary) problems (especially infection) following:  A long period of time when you are unable to move or be active. BEFORE THE PROCEDURE   If the spirometer includes an indicator to show your best effort, your nurse or respiratory therapist will set it to a desired goal.  If possible, sit up straight or lean slightly forward. Try not to slouch.  Hold the incentive spirometer in an upright position. INSTRUCTIONS FOR USE  1. Sit on the edge of your bed if possible, or sit up as far as you can in bed or on a chair. 2. Hold the incentive spirometer in an upright position. 3. Breathe out normally. 4. Place the mouthpiece in your mouth and seal your lips tightly around it. 5. Breathe in slowly and as deeply as possible, raising the piston or the ball toward the top of the column. 6. Hold your breath for 3-5 seconds or for as long as possible. Allow the piston or ball to fall to the bottom of the column. 7. Remove the mouthpiece from your mouth and breathe out normally. 8. Rest for a few seconds and repeat Steps 1 through 7 at least 10 times every 1-2 hours when you are awake. Take your time and take a few normal breaths between deep breaths. 9. The spirometer may include an indicator to show your best effort. Use the indicator as a goal to work toward during each repetition. 10. After each set of 10 deep breaths, practice coughing to be sure your lungs are clear. If you have an incision (the cut made at the time of surgery),  support your incision when coughing by placing a pillow or rolled up towels firmly against it. Once you are able to get out of bed, walk around indoors and cough well. You may stop using the incentive spirometer when instructed by your caregiver.  RISKS AND COMPLICATIONS  Take your time so you do not get dizzy or light-headed.  If you are in pain, you may need to take or ask for pain  medication before doing incentive spirometry. It is harder to take a deep breath if you are having pain. AFTER USE  Rest and breathe slowly and easily.  It can be helpful to keep track of a log of your progress. Your caregiver can provide you with a simple table to help with this. If you are using the spirometer at home, follow these instructions: [place] IF:   You are having difficultly using the spirometer.  You have trouble using the spirometer as often as instructed.  Your pain medication is not giving enough relief while using the spirometer.  You develop fever of 100.5 F (38.1 C) or higher. SEEK IMMEDIATE MEDICAL CARE IF:   You cough up bloody sputum that had not been present before.  You develop fever of 102 F (38.9 C) or greater.  You develop worsening pain at or near the incision site. MAKE SURE YOU:   Understand these instructions.  Will watch your condition.  Will get help right away if you are not doing well or get worse. Document Released: 12/31/2006 Document Revised: 11/12/2011 Document Reviewed: 03/03/2007 ExitCare Patient Information 2014 Exit[employer], [place].   ________________________________________________________________________  WHAT IS A BLOOD TRANSFUSION? Blood Transfusion Information  A transfusion is the replacement of blood or some of its parts. Blood is made up of multiple cells which provide different functions.  Red blood cells carry oxygen and are used for blood loss replacement.  White blood cells fight against infection.  Platelets control bleeding.  Plasma helps clot blood.  Other blood products are available for specialized needs, such as hemophilia or other clotting disorders. BEFORE THE TRANSFUSION  Who gives blood for transfusions?   Healthy volunteers who are fully evaluated to make sure their blood is safe. This is blood bank blood. Transfusion therapy is the safest it has ever been in the practice of medicine.  Before blood is taken from a donor, a complete history is taken to make sure that person has no history of diseases nor engages in risky social behavior (examples are intravenous drug use or sexual activity with multiple partners). The donor's travel history is screened to minimize risk of transmitting infections, such as malaria. The donated blood is tested for signs of infectious diseases, such as HIV and hepatitis. The blood is then tested to be sure it is compatible with you in order to minimize the chance of a transfusion reaction. If you or a relative donates blood, this is often done in anticipation of surgery and is not appropriate for emergency situations. It takes many days to process the donated blood. RISKS AND COMPLICATIONS Although transfusion therapy is very safe and saves many lives, the main dangers of transfusion include:   Getting an infectious disease.  Developing a transfusion reaction. This is an allergic reaction to something in the blood you were given. Every precaution is taken to prevent this. The decision to have a blood transfusion has been considered carefully by your caregiver before blood is given. Blood is not given unless the benefits outweigh the risks. AFTER THE TRANSFUSION  Right after receiving a blood transfusion, you will usually feel much better and more energetic. This is especially true if your red blood cells have gotten low (anemic). The transfusion raises the level of the red blood cells which carry oxygen, and this usually causes an energy increase.  The nurse administering the transfusion will monitor you carefully for complications. HOME CARE INSTRUCTIONS  No special instructions are needed after a transfusion. You may find your energy is better. Speak with your caregiver about any limitations on activity for underlying diseases you may have. SEEK MEDICAL CARE IF:   Your condition is not improving after your transfusion.  You develop redness or  irritation at the intravenous (IV) site. SEEK IMMEDIATE MEDICAL CARE IF:  Any of the following symptoms occur over the next 12 hours:  Shaking chills.  You have a temperature by mouth above 102 F (38.9 C), not controlled by medicine.  Chest, back, or muscle pain.  People around you feel you are not acting correctly or are confused.  Shortness of breath or difficulty breathing.  Dizziness and fainting.  You get a rash or develop hives.  You have a decrease in urine output.  Your urine turns a dark color or changes to pink, red, or brown. Any of the following symptoms occur over the next 10 days:  You have a temperature by mouth above 102 F (38.9 C), not controlled by medicine.  Shortness of breath.  Weakness after normal activity.  The white part of the eye turns yellow (jaundice).  You have a decrease in the amount of urine or are urinating less often.  Your urine turns a dark color or changes to pink, red, or brown. Document Released: 08/17/2000 Document Revised: 11/12/2011 Document Reviewed: 04/05/2008 [facility] Patient Information 2014 [place], [place].  _______________________________________________________________________

## 2016-09-11 ENCOUNTER — Encounter (HOSPITAL_COMMUNITY): Payer: Self-pay

## 2016-09-11 ENCOUNTER — Encounter (HOSPITAL_COMMUNITY)
Admission: RE | Admit: 2016-09-11 | Discharge: 2016-09-11 | Disposition: A | Discharge status: Home / Self Care | Payer: Medicare Other | Source: Ambulatory Visit | Attending: Orthopedic Surgery | Admitting: Orthopedic Surgery

## 2016-09-11 DIAGNOSIS — Z01818 Encounter for other preprocedural examination: Secondary | ICD-10-CM | POA: Insufficient documentation

## 2016-09-11 DIAGNOSIS — E039 Hypothyroidism, unspecified: Secondary | ICD-10-CM | POA: Diagnosis not present

## 2016-09-11 DIAGNOSIS — M1711 Unilateral primary osteoarthritis, right knee: Secondary | ICD-10-CM | POA: Insufficient documentation

## 2016-09-11 HISTORY — DX: Essential (primary) hypertension: I10

## 2016-09-11 HISTORY — DX: Unspecified osteoarthritis, unspecified site: M19.90

## 2016-09-11 HISTORY — DX: Hypothyroidism, unspecified: E03.9

## 2016-09-11 LAB — BASIC METABOLIC PANEL
ANION GAP: 7 (ref 5–15)
BUN: 22 mg/dL — ABNORMAL HIGH (ref 6–20)
CALCIUM: 9.1 mg/dL (ref 8.9–10.3)
CHLORIDE: 109 mmol/L (ref 101–111)
CO2: 26 mmol/L (ref 22–32)
Creatinine, Ser: 0.67 mg/dL (ref 0.61–1.24)
GFR calc non Af Amer: >60 mL/min (ref >60)
GLUCOSE: 96 mg/dL (ref 65–99)
Potassium: 4.2 mmol/L (ref 3.5–5.1)
Sodium: 142 mmol/L (ref 135–145)

## 2016-09-11 LAB — CBC
HCT: 37.6 % — ABNORMAL LOW (ref 39.0–52.0)
HEMOGLOBIN: 12.8 g/dL — AB (ref 13.0–17.0)
MCH: 30.7 pg (ref 26.0–34.0)
MCHC: 34 g/dL (ref 30.0–36.0)
MCV: 90.2 fL (ref 78.0–100.0)
Platelets: 240 10*3/uL (ref 150–400)
RBC: 4.17 MIL/uL — AB (ref 4.22–5.81)
RDW: 13 % (ref 11.5–15.5)
WBC: 4.4 10*3/uL (ref 4.0–10.5)

## 2016-09-11 LAB — SURGICAL PCR SCREEN
MRSA, PCR: NEGATIVE
Staphylococcus aureus: NEGATIVE

## 2016-09-11 LAB — ABO/RH: ABO/RH(D): O POS

## 2016-09-12 LAB — TSH: TSH: 3.31 u[IU]/mL (ref 0.450–4.500)

## 2016-09-12 LAB — T3 UPTAKE
Free Thyroxine Index: 1.7 (ref 1.2–4.9)
T3 Uptake Ratio: 25 % (ref 24–39)

## 2016-09-12 LAB — T4: T4 TOTAL: 6.6 ug/dL (ref 4.5–12.0)

## 2016-09-13 ENCOUNTER — Telehealth: Payer: Self-pay

## 2016-09-13 NOTE — Telephone Encounter (Signed)
LMTCB

## 2016-09-13 NOTE — Telephone Encounter (Signed)
Patient advised.

## 2016-09-13 NOTE — Telephone Encounter (Signed)
-----   Message from Margo Common, [place] sent at 09/13/2016  2:00 PM EST ----- Improved thyroid function tests. Continue Levothyroxine 25 mcg qd and recheck at next physical.

## 2016-09-17 NOTE — Anesthesia Preprocedure Evaluation (Addendum)
Anesthesia Evaluation  Patient identified by MRN, date of birth, ID band Patient awake    Reviewed: Allergy & Precautions, H&P , Patient's Chart, lab work & pertinent test results  Airway Mallampati: II  TM Distance: >3 FB Neck ROM: full    Dental no notable dental hx.    Pulmonary former smoker,    Pulmonary exam normal breath sounds clear to auscultation       Cardiovascular Exercise Tolerance: Good hypertension,  Rhythm:regular Rate:Normal     Neuro/Psych    GI/Hepatic   Endo/Other    Renal/GU      Musculoskeletal   Abdominal   Peds  Hematology   Anesthesia Other Findings  HTN Hb 12.8  Reproductive/Obstetrics                             Anesthesia Physical Anesthesia Plan  ASA: II  Anesthesia Plan: Spinal   Post-op Pain Management:    Induction:   Airway Management Planned:   Additional Equipment:   Intra-op Plan:   Post-operative Plan:   Informed Consent: I have reviewed the patients History and Physical, chart, labs and discussed the procedure including the risks, benefits and alternatives for the proposed anesthesia with the patient or authorized representative who has indicated his/her understanding and acceptance.   Dental Advisory Given  Plan Discussed with: CRNA  Anesthesia Plan Comments: (Lab work and procedure  confirmed with CRNA in room; platelets okay. Discussed spinal anesthetic, and patient consents to the procedure:  included risk of possible headache,backache, failed block, allergic reaction, and nerve injury. This patient was asked if she had any questions or concerns before the procedure started.  )        Anesthesia Quick Evaluation

## 2016-09-18 ENCOUNTER — Encounter (HOSPITAL_COMMUNITY): Payer: Self-pay | Admitting: *Deleted

## 2016-09-18 ENCOUNTER — Inpatient Hospital Stay (HOSPITAL_COMMUNITY): Payer: Medicare Other | Admitting: Anesthesiology

## 2016-09-18 ENCOUNTER — Encounter (HOSPITAL_COMMUNITY)
Admission: RE | Disposition: A | Discharge status: Home / Self Care | Payer: Self-pay | Source: Ambulatory Visit | Attending: Orthopedic Surgery

## 2016-09-18 ENCOUNTER — Inpatient Hospital Stay (HOSPITAL_COMMUNITY)
Admission: RE | Admit: 2016-09-18 | Discharge: 2016-09-19 | DRG: 470 | Disposition: A | Discharge status: Home / Self Care | Payer: Medicare Other | Source: Ambulatory Visit | Attending: Orthopedic Surgery | Admitting: Orthopedic Surgery

## 2016-09-18 DIAGNOSIS — R351 Nocturia: Secondary | ICD-10-CM | POA: Diagnosis present

## 2016-09-18 DIAGNOSIS — Z8249 Family history of ischemic heart disease and other diseases of the circulatory system: Secondary | ICD-10-CM | POA: Diagnosis not present

## 2016-09-18 DIAGNOSIS — Z800 Family history of malignant neoplasm of digestive organs: Secondary | ICD-10-CM | POA: Diagnosis not present

## 2016-09-18 DIAGNOSIS — E039 Hypothyroidism, unspecified: Secondary | ICD-10-CM | POA: Diagnosis present

## 2016-09-18 DIAGNOSIS — E785 Hyperlipidemia, unspecified: Secondary | ICD-10-CM | POA: Diagnosis present

## 2016-09-18 DIAGNOSIS — M1711 Unilateral primary osteoarthritis, right knee: Secondary | ICD-10-CM | POA: Diagnosis not present

## 2016-09-18 DIAGNOSIS — I10 Essential (primary) hypertension: Secondary | ICD-10-CM | POA: Diagnosis present

## 2016-09-18 DIAGNOSIS — M659 Synovitis and tenosynovitis, unspecified: Secondary | ICD-10-CM | POA: Diagnosis present

## 2016-09-18 DIAGNOSIS — Z96651 Presence of right artificial knee joint: Secondary | ICD-10-CM

## 2016-09-18 DIAGNOSIS — Z8261 Family history of arthritis: Secondary | ICD-10-CM

## 2016-09-18 DIAGNOSIS — Z87891 Personal history of nicotine dependence: Secondary | ICD-10-CM | POA: Diagnosis not present

## 2016-09-18 DIAGNOSIS — Z818 Family history of other mental and behavioral disorders: Secondary | ICD-10-CM | POA: Diagnosis not present

## 2016-09-18 DIAGNOSIS — G8918 Other acute postprocedural pain: Secondary | ICD-10-CM | POA: Diagnosis not present

## 2016-09-18 DIAGNOSIS — Z96659 Presence of unspecified artificial knee joint: Secondary | ICD-10-CM

## 2016-09-18 HISTORY — PX: TOTAL KNEE ARTHROPLASTY: SHX125

## 2016-09-18 LAB — TYPE AND SCREEN
ABO/RH(D): O POS
ANTIBODY SCREEN: NEGATIVE

## 2016-09-18 SURGERY — ARTHROPLASTY, KNEE, TOTAL
Anesthesia: Spinal | Site: Knee | Laterality: Right

## 2016-09-18 MED ORDER — LEVOTHYROXINE SODIUM 25 MCG PO TABS
25.0000 ug | ORAL_TABLET | Freq: Every day | ORAL | Status: DC
  Administered 2016-09-19: 08:00:00 25 ug via ORAL
  Filled 2016-09-18: qty 1

## 2016-09-18 MED ORDER — MIDAZOLAM HCL 5 MG/5ML IJ SOLN
INTRAMUSCULAR | Status: DC | PRN
  Administered 2016-09-18 (×2): 1 mg via INTRAVENOUS

## 2016-09-18 MED ORDER — CHLORHEXIDINE GLUCONATE 4 % EX LIQD: 60.0000 mL | Freq: Once | CUTANEOUS | Status: DC

## 2016-09-18 MED ORDER — PROPOFOL 500 MG/50ML IV EMUL
INTRAVENOUS | Status: DC | PRN
  Administered 2016-09-18: 20 mg via INTRAVENOUS

## 2016-09-18 MED ORDER — ALUM & MAG HYDROXIDE-SIMETH 200-200-20 MG/5ML PO SUSP: 30.0000 mL | ORAL | Status: DC | PRN

## 2016-09-18 MED ORDER — SODIUM CHLORIDE 0.9 % IJ SOLN
INTRAMUSCULAR | Status: DC | PRN
  Administered 2016-09-18: 29 mL

## 2016-09-18 MED ORDER — CEFAZOLIN SODIUM-DEXTROSE 2-4 GM/100ML-% IV SOLN
INTRAVENOUS | Status: AC
  Filled 2016-09-18: qty 100

## 2016-09-18 MED ORDER — ROPIVACAINE HCL 5 MG/ML IJ SOLN
INTRAMUSCULAR | Status: AC
  Filled 2016-09-18: qty 30

## 2016-09-18 MED ORDER — CEFAZOLIN SODIUM-DEXTROSE 2-4 GM/100ML-% IV SOLN
2.0000 g | Freq: Four times a day (QID) | INTRAVENOUS | Status: AC
  Administered 2016-09-18 – 2016-09-19 (×2): 2 g via INTRAVENOUS
  Filled 2016-09-18 (×2): qty 100

## 2016-09-18 MED ORDER — ONDANSETRON HCL 4 MG PO TABS: 4.0000 mg | ORAL_TABLET | Freq: Four times a day (QID) | ORAL | Status: DC | PRN

## 2016-09-18 MED ORDER — MIDAZOLAM HCL 5 MG/ML IJ SOLN
1.0000 mg | INTRAMUSCULAR | Status: DC | PRN
  Administered 2016-09-18: 1 mg via INTRAVENOUS

## 2016-09-18 MED ORDER — HYDROMORPHONE HCL 1 MG/ML IJ SOLN: 0.5000 mg | INTRAMUSCULAR | Status: DC | PRN

## 2016-09-18 MED ORDER — BUPIVACAINE HCL (PF) 0.25 % IJ SOLN
INTRAMUSCULAR | Status: DC | PRN
  Administered 2016-09-18: 30 mL

## 2016-09-18 MED ORDER — PHENYLEPHRINE HCL 10 MG/ML IJ SOLN
INTRAVENOUS | Status: DC | PRN
  Administered 2016-09-18: 10 ug/min via INTRAVENOUS

## 2016-09-18 MED ORDER — KETOROLAC TROMETHAMINE 30 MG/ML IJ SOLN
INTRAMUSCULAR | Status: DC | PRN
  Administered 2016-09-18: 30 mg

## 2016-09-18 MED ORDER — METOCLOPRAMIDE HCL 5 MG/ML IJ SOLN: 5.0000 mg | Freq: Three times a day (TID) | INTRAMUSCULAR | Status: DC | PRN

## 2016-09-18 MED ORDER — DEXAMETHASONE SODIUM PHOSPHATE 10 MG/ML IJ SOLN
INTRAMUSCULAR | Status: AC
  Filled 2016-09-18: qty 1

## 2016-09-18 MED ORDER — SODIUM CHLORIDE 0.9 % IR SOLN
Status: DC | PRN
  Administered 2016-09-18: 1000 mL

## 2016-09-18 MED ORDER — DOCUSATE SODIUM 100 MG PO CAPS
100.0000 mg | ORAL_CAPSULE | Freq: Two times a day (BID) | ORAL | Status: DC
  Administered 2016-09-18 – 2016-09-19 (×2): 100 mg via ORAL
  Filled 2016-09-18 (×2): qty 1

## 2016-09-18 MED ORDER — LACTATED RINGERS IV SOLN
INTRAVENOUS | Status: DC | PRN
  Administered 2016-09-18 (×2): via INTRAVENOUS

## 2016-09-18 MED ORDER — LACTATED RINGERS IV SOLN: INTRAVENOUS | Status: DC

## 2016-09-18 MED ORDER — MIDAZOLAM HCL 2 MG/2ML IJ SOLN
INTRAMUSCULAR | Status: AC
  Filled 2016-09-18: qty 2

## 2016-09-18 MED ORDER — FENTANYL CITRATE (PF) 100 MCG/2ML IJ SOLN
INTRAMUSCULAR | Status: AC
  Filled 2016-09-18: qty 2

## 2016-09-18 MED ORDER — BUPIVACAINE IN DEXTROSE 0.75-8.25 % IT SOLN
INTRATHECAL | Status: DC | PRN
  Administered 2016-09-18: 2 mL via INTRATHECAL

## 2016-09-18 MED ORDER — METHOCARBAMOL 500 MG PO TABS
500.0000 mg | ORAL_TABLET | Freq: Four times a day (QID) | ORAL | Status: DC | PRN
  Administered 2016-09-19: 04:00:00 500 mg via ORAL
  Filled 2016-09-18: qty 1

## 2016-09-18 MED ORDER — KETOROLAC TROMETHAMINE 30 MG/ML IJ SOLN
INTRAMUSCULAR | Status: AC
  Filled 2016-09-18: qty 1

## 2016-09-18 MED ORDER — DIPHENHYDRAMINE HCL 25 MG PO CAPS: 25.0000 mg | ORAL_CAPSULE | Freq: Four times a day (QID) | ORAL | Status: DC | PRN

## 2016-09-18 MED ORDER — FENTANYL CITRATE (PF) 100 MCG/2ML IJ SOLN
50.0000 ug | INTRAMUSCULAR | Status: DC | PRN
  Administered 2016-09-18: 50 ug via INTRAVENOUS

## 2016-09-18 MED ORDER — MENTHOL 3 MG MT LOZG: 1.0000 | LOZENGE | OROMUCOSAL | Status: DC | PRN

## 2016-09-18 MED ORDER — POLYETHYLENE GLYCOL 3350 17 G PO PACK
17.0000 g | PACK | Freq: Two times a day (BID) | ORAL | Status: DC
  Administered 2016-09-18 – 2016-09-19 (×2): 17 g via ORAL
  Filled 2016-09-18 (×2): qty 1

## 2016-09-18 MED ORDER — TRANEXAMIC ACID 1000 MG/10ML IV SOLN
1000.0000 mg | INTRAVENOUS | Status: AC
  Administered 2016-09-18: 1000 mg via INTRAVENOUS
  Filled 2016-09-18: qty 1100

## 2016-09-18 MED ORDER — PROPOFOL 10 MG/ML IV BOLUS
INTRAVENOUS | Status: AC
  Filled 2016-09-18: qty 20

## 2016-09-18 MED ORDER — STERILE WATER FOR IRRIGATION IR SOLN
Status: DC | PRN
  Administered 2016-09-18: 2000 mL

## 2016-09-18 MED ORDER — SODIUM CHLORIDE 0.9 % IJ SOLN
INTRAMUSCULAR | Status: AC
  Filled 2016-09-18: qty 50

## 2016-09-18 MED ORDER — FENTANYL CITRATE (PF) 100 MCG/2ML IJ SOLN: 25.0000 ug | INTRAMUSCULAR | Status: DC | PRN

## 2016-09-18 MED ORDER — PHENYLEPHRINE HCL 10 MG/ML IJ SOLN: 30.0000 ug/min | INTRAVENOUS | Status: DC

## 2016-09-18 MED ORDER — AMLODIPINE BESYLATE 5 MG PO TABS
5.0000 mg | ORAL_TABLET | Freq: Every day | ORAL | Status: DC
  Administered 2016-09-19: 08:00:00 5 mg via ORAL
  Filled 2016-09-18: qty 1

## 2016-09-18 MED ORDER — METHOCARBAMOL 1000 MG/10ML IJ SOLN
500.0000 mg | Freq: Four times a day (QID) | INTRAVENOUS | Status: DC | PRN
  Administered 2016-09-18: 500 mg via INTRAVENOUS
  Filled 2016-09-18: qty 5
  Filled 2016-09-18: qty 550

## 2016-09-18 MED ORDER — HYDROCODONE-ACETAMINOPHEN 7.5-325 MG PO TABS
1.0000 | ORAL_TABLET | ORAL | Status: DC
  Administered 2016-09-18: 23:00:00 2 via ORAL
  Administered 2016-09-18: 14:00:00 1 via ORAL
  Administered 2016-09-18: 19:00:00 2 via ORAL
  Administered 2016-09-18: 14:00:00 1 via ORAL
  Administered 2016-09-19 (×3): 2 via ORAL
  Filled 2016-09-18 (×4): qty 2
  Filled 2016-09-18: qty 1
  Filled 2016-09-18 (×2): qty 2

## 2016-09-18 MED ORDER — ONDANSETRON HCL 4 MG/2ML IJ SOLN: 4.0000 mg | Freq: Four times a day (QID) | INTRAMUSCULAR | Status: DC | PRN

## 2016-09-18 MED ORDER — ONDANSETRON HCL 4 MG/2ML IJ SOLN
INTRAMUSCULAR | Status: AC
  Filled 2016-09-18: qty 2

## 2016-09-18 MED ORDER — DEXAMETHASONE SODIUM PHOSPHATE 10 MG/ML IJ SOLN: 10.0000 mg | Freq: Once | INTRAMUSCULAR | Status: DC

## 2016-09-18 MED ORDER — FERROUS SULFATE 325 (65 FE) MG PO TABS
325.0000 mg | ORAL_TABLET | Freq: Three times a day (TID) | ORAL | Status: DC
  Administered 2016-09-18 – 2016-09-19 (×3): 325 mg via ORAL
  Filled 2016-09-18 (×3): qty 1

## 2016-09-18 MED ORDER — PROPOFOL 10 MG/ML IV BOLUS
INTRAVENOUS | Status: AC
  Filled 2016-09-18: qty 40

## 2016-09-18 MED ORDER — DEXAMETHASONE SODIUM PHOSPHATE 10 MG/ML IJ SOLN
10.0000 mg | Freq: Once | INTRAMUSCULAR | Status: AC
  Administered 2016-09-19: 08:00:00 10 mg via INTRAVENOUS
  Filled 2016-09-18: qty 1

## 2016-09-18 MED ORDER — 0.9 % SODIUM CHLORIDE (POUR BTL) OPTIME
TOPICAL | Status: DC | PRN
  Administered 2016-09-18: 1000 mL

## 2016-09-18 MED ORDER — BISACODYL 10 MG RE SUPP: 10.0000 mg | Freq: Every day | RECTAL | Status: DC | PRN

## 2016-09-18 MED ORDER — CEFAZOLIN SODIUM-DEXTROSE 2-4 GM/100ML-% IV SOLN
2.0000 g | INTRAVENOUS | Status: AC
  Administered 2016-09-18: 2 g via INTRAVENOUS

## 2016-09-18 MED ORDER — PHENOL 1.4 % MT LIQD: 1.0000 | OROMUCOSAL | Status: DC | PRN

## 2016-09-18 MED ORDER — PROPOFOL 500 MG/50ML IV EMUL
INTRAVENOUS | Status: DC | PRN
  Administered 2016-09-18: 100 ug/kg/min via INTRAVENOUS

## 2016-09-18 MED ORDER — ROPIVACAINE HCL 5 MG/ML IJ SOLN
INTRAMUSCULAR | Status: DC | PRN
  Administered 2016-09-18: 25 mL

## 2016-09-18 MED ORDER — METOCLOPRAMIDE HCL 5 MG PO TABS: 5.0000 mg | ORAL_TABLET | Freq: Three times a day (TID) | ORAL | Status: DC | PRN

## 2016-09-18 MED ORDER — ASPIRIN 81 MG PO CHEW
81.0000 mg | CHEWABLE_TABLET | Freq: Two times a day (BID) | ORAL | Status: DC
  Administered 2016-09-18 – 2016-09-19 (×2): 81 mg via ORAL
  Filled 2016-09-18 (×2): qty 1

## 2016-09-18 MED ORDER — BUPIVACAINE HCL (PF) 0.25 % IJ SOLN
INTRAMUSCULAR | Status: AC
  Filled 2016-09-18: qty 30

## 2016-09-18 MED ORDER — MAGNESIUM CITRATE PO SOLN: 1.0000 | Freq: Once | ORAL | Status: DC | PRN

## 2016-09-18 MED ORDER — CELECOXIB 200 MG PO CAPS
200.0000 mg | ORAL_CAPSULE | Freq: Two times a day (BID) | ORAL | Status: DC
  Administered 2016-09-18 – 2016-09-19 (×2): 200 mg via ORAL
  Filled 2016-09-18 (×2): qty 1

## 2016-09-18 MED ORDER — ONDANSETRON HCL 4 MG/2ML IJ SOLN
INTRAMUSCULAR | Status: DC | PRN
  Administered 2016-09-18: 4 mg via INTRAVENOUS

## 2016-09-18 MED ORDER — PHENYLEPHRINE HCL 10 MG/ML IJ SOLN
INTRAMUSCULAR | Status: AC
  Filled 2016-09-18: qty 1

## 2016-09-18 MED ORDER — SODIUM CHLORIDE 0.9 % IV SOLN
INTRAVENOUS | Status: DC
  Administered 2016-09-18: 17:00:00 via INTRAVENOUS
  Filled 2016-09-18 (×4): qty 1000

## 2016-09-18 SURGICAL SUPPLY — 41 items
BAG ZIPLOCK 12X15 (MISCELLANEOUS) ×3 IMPLANT
BANDAGE ACE 6X5 VEL STRL LF (GAUZE/BANDAGES/DRESSINGS) ×3 IMPLANT
BLADE SAW SGTL 13.0X1.19X90.0M (BLADE) ×3 IMPLANT
BOWL SMART MIX CTS (DISPOSABLE) ×3 IMPLANT
CAPT KNEE TOTAL 3 ATTUNE ×3 IMPLANT
CEMENT HV SMART SET (Cement) ×6 IMPLANT
CLOTH BEACON ORANGE TIMEOUT ST (SAFETY) ×3 IMPLANT
CUFF TOURN SGL QUICK 34 (TOURNIQUET CUFF) ×2
CUFF TRNQT CYL 34X4X40X1 (TOURNIQUET CUFF) ×1 IMPLANT
DECANTER SPIKE VIAL GLASS SM (MISCELLANEOUS) ×3 IMPLANT
DERMABOND ADVANCED (GAUZE/BANDAGES/DRESSINGS) ×2
DERMABOND ADVANCED .7 DNX12 (GAUZE/BANDAGES/DRESSINGS) ×1 IMPLANT
DRAPE U-SHAPE 47X51 STRL (DRAPES) ×3 IMPLANT
DRSG AQUACEL AG ADV 3.5X10 (GAUZE/BANDAGES/DRESSINGS) ×3 IMPLANT
DURAPREP 26ML APPLICATOR (WOUND CARE) ×6 IMPLANT
ELECT REM PT RETURN 9FT ADLT (ELECTROSURGICAL) ×3
ELECTRODE REM PT RTRN 9FT ADLT (ELECTROSURGICAL) ×1 IMPLANT
GLOVE BIOGEL M 7.0 STRL (GLOVE) ×6 IMPLANT
GLOVE BIOGEL PI IND STRL 7.5 (GLOVE) ×5 IMPLANT
GLOVE BIOGEL PI IND STRL 8.5 (GLOVE) ×1 IMPLANT
GLOVE BIOGEL PI INDICATOR 7.5 (GLOVE) ×10
GLOVE BIOGEL PI INDICATOR 8.5 (GLOVE) ×2
GLOVE ECLIPSE 8.0 STRL XLNG CF (GLOVE) ×3 IMPLANT
GLOVE ORTHO TXT STRL SZ7.5 (GLOVE) ×6 IMPLANT
GOWN SPEC L3 XXLG W/TWL (GOWN DISPOSABLE) ×3 IMPLANT
GOWN STRL REUS W/TWL LRG LVL3 (GOWN DISPOSABLE) ×3 IMPLANT
GOWN STRL REUS W/TWL XL LVL3 (GOWN DISPOSABLE) ×6 IMPLANT
HANDPIECE INTERPULSE COAX TIP (DISPOSABLE) ×2
MANIFOLD NEPTUNE II (INSTRUMENTS) ×3 IMPLANT
PACK TOTAL KNEE CUSTOM (KITS) ×3 IMPLANT
POSITIONER SURGICAL ARM (MISCELLANEOUS) ×3 IMPLANT
SET HNDPC FAN SPRY TIP SCT (DISPOSABLE) ×1 IMPLANT
SET PAD KNEE POSITIONER (MISCELLANEOUS) ×3 IMPLANT
SUT MNCRL AB 4-0 PS2 18 (SUTURE) ×3 IMPLANT
SUT VIC AB 1 CT1 36 (SUTURE) ×3 IMPLANT
SUT VIC AB 2-0 CT1 27 (SUTURE) ×6
SUT VIC AB 2-0 CT1 TAPERPNT 27 (SUTURE) ×3 IMPLANT
SUT VLOC 180 0 24IN GS25 (SUTURE) ×3 IMPLANT
TRAY FOLEY W/METER SILVER 16FR (SET/KITS/TRAYS/PACK) ×3 IMPLANT
WRAP KNEE MAXI GEL POST OP (GAUZE/BANDAGES/DRESSINGS) ×3 IMPLANT
YANKAUER SUCT BULB TIP 10FT TU (MISCELLANEOUS) ×3 IMPLANT

## 2016-09-18 NOTE — Anesthesia Postprocedure Evaluation (Signed)
Anesthesia Post Note  Patient: Joseph Whitaker  Procedure(s) Performed: Procedure(s) (LRB): Right TOTAL KNEE ARTHROPLASTY (Right)  Patient location during evaluation: PACU Anesthesia Type: Spinal Level of consciousness: awake Pain management: satisfactory to patient Vital Signs Assessment: post-procedure vital signs reviewed and stable Respiratory status: spontaneous breathing Cardiovascular status: blood pressure returned to baseline Postop Assessment: no headache and spinal receding Anesthetic complications: no       Last Vitals:  Vitals:   09/18/16 1506 09/18/16 1552  BP: 115/67 103/62  Pulse: 82 79  Resp: 18 18  Temp: 36.6 C 36.7 C    Last Pain:  Vitals:   09/18/16 1552  TempSrc: Oral  PainSc:                  Riccardo Dubin

## 2016-09-18 NOTE — Evaluation (Signed)
Physical Therapy Evaluation Patient Details Name: Joseph Whitaker MRN: [id number] DOB: 09-18-1945 Today's Date: 09/18/2016   History of Present Illness  s/p R TKA  Clinical Impression  Pt is s/p TKA resulting in the deficits listed below (see PT Problem List). * Pt will benefit from skilled PT to increase their independence and safety with mobility to allow discharge to the venue listed below.      Follow Up Recommendations Outpatient PT    Equipment Recommendations  None recommended by PT    Recommendations for Other Services       Precautions / Restrictions Precautions Precautions: Fall;Knee Restrictions Weight Bearing Restrictions: No Other Position/Activity Restrictions: WBAT      Mobility  Bed Mobility Overal bed mobility: Needs Assistance Bed Mobility: Supine to Sit     Supine to sit: Supervision     General bed mobility comments: for lines and safety  Transfers Overall transfer level: Needs assistance Equipment used: Rolling walker (2 wheeled) Transfers: Sit to/from Stand Sit to Stand: Min guard         General transfer comment: cues for hand placement  Ambulation/Gait Ambulation/Gait assistance: Min assist Ambulation Distance (Feet): 80 Feet Assistive device: Rolling walker (2 wheeled) Gait Pattern/deviations: Step-through pattern;Step-to pattern     General Gait Details: cues for sequence and RW position  Stairs            Wheelchair Mobility    Modified Rankin (Stroke Patients Only)       Balance Overall balance assessment: Needs assistance                                           Pertinent Vitals/Pain Pain Assessment: 0-10 Pain Score: 1  Pain Location: R knee Pain Descriptors / Indicators: Aching;Sore Pain Intervention(s): Limited activity within patient's tolerance;Monitored during session    University expects to be discharged to:: Private residence Living Arrangements:  Spouse/significant other Available Help at Discharge: Available 24 hours/day Type of Home: House Home Access: Stairs to enter   [employer] of Steps: 4 Home Layout: One level Home Equipment: Environmental consultant - 2 wheels;Bedside commode      Prior Function Level of Independence: Independent               Hand Dominance        Extremity/Trunk Assessment   Upper Extremity Assessment Upper Extremity Assessment: Defer to OT evaluation    Lower Extremity Assessment Lower Extremity Assessment: RLE deficits/detail RLE Deficits / Details: ankle WLF; knee extension and hip flexion 2+/5,limited by post op pain and weakness       Communication   Communication: No difficulties  Cognition Arousal/Alertness: Awake/alert Behavior During Therapy: WFL for tasks assessed/performed Overall Cognitive Status: Within Functional Limits for tasks assessed                      General Comments      Exercises Total Joint Exercises Ankle Circles/Pumps: AROM;Both;10 reps Quad Sets: 10 reps;Both;AROM   Assessment/Plan    PT Assessment Patient needs continued PT services  PT Problem List Decreased strength;Decreased range of motion;Decreased balance;Decreased activity tolerance;Decreased mobility;Decreased knowledge of use of DME          PT Treatment Interventions DME instruction;Gait training;Functional mobility training;Therapeutic activities;Therapeutic exercise;Patient/family education    PT Goals (Current goals can be found in the Care Plan section)  Acute Rehab  PT Goals Patient Stated Goal: return to independence PT Goal Formulation: With patient Time For Goal Achievement: 09/25/16 Potential to Achieve Goals: Good    Frequency 7X/week   Barriers to discharge        Co-evaluation               End of Session Equipment Utilized During Treatment: Gait belt Activity Tolerance: Patient tolerated treatment well Patient left: in chair;with call bell/phone  within reach;with family/visitor present;with chair alarm set           Time: 1444-1500 PT Time Calculation (min) (ACUTE ONLY): 16 min   Charges:   PT Evaluation $PT Eval Low Complexity: 1 Procedure     PT G Codes:        Adeana Grilliot 09/28/2016, 4:06 PM

## 2016-09-18 NOTE — Transfer of Care (Signed)
Immediate Anesthesia Transfer of Care Note  Patient: Joseph Whitaker  Procedure(s) Performed: Procedure(s) with comments: Right TOTAL KNEE ARTHROPLASTY (Right) - Adductor Block  Patient Location: PACU  Anesthesia Type:MAC and Spinal  Level of Consciousness: awake, alert  and oriented  Airway & Oxygen Therapy: Patient Spontanous Breathing and Patient connected to face mask oxygen  Post-op Assessment: Report given to RN and Post -op Vital signs reviewed and stable  Post vital signs: Reviewed and stable  Last Vitals:  Vitals:   09/18/16 0820 09/18/16 0821  BP: 114/71   Pulse: 72 79  Resp: 13 12  Temp:      Last Pain:  Vitals:   09/18/16 0815  TempSrc:   PainSc: 2       Patients Stated Pain Goal: 4 (47/42/59 [id number])  Complications: No apparent anesthesia complications

## 2016-09-18 NOTE — Progress Notes (Signed)
AssistedDr. Kyle Jackson with right, ultrasound guided, adductor canal block. Side rails up, monitors on throughout procedure. See vital signs in flow sheet. Tolerated Procedure well.  

## 2016-09-18 NOTE — Anesthesia Procedure Notes (Signed)
Anesthesia Regional Block:  Adductor canal block  Pre-Anesthetic Checklist: ,, timeout performed, Correct Patient, Correct Site, Correct Laterality, Correct Procedure, Correct Position, site marked, Risks and benefits discussed, pre-op evaluation,  At surgeon's request and post-op pain management  Laterality: Right  Prep: chloraprep       Needles:   Needle Type: Echogenic Needle     Needle Length: 9cm 9 cm Needle Gauge: 21 and 21 G    Additional Needles:  Procedures: ultrasound guided (picture in chart) Adductor canal block Narrative:  Start time: 09/18/2016 8:07 AM End time: 09/18/2016 8:11 AM Injection made incrementally with aspirations every 5 mL. Anesthesiologist: Lyndle Herrlich  Additional Notes: 25cc .5% Naropin

## 2016-09-18 NOTE — Interval H&P Note (Signed)
History and Physical Interval Note:  09/18/2016 7:05 AM  Joseph Whitaker  has presented today for surgery, with the diagnosis of Right knee osteoarthritis  The various methods of treatment have been discussed with the patient and family. After consideration of risks, benefits and other options for treatment, the patient has consented to  Procedure(s): Right TOTAL KNEE ARTHROPLASTY (Right) as a surgical intervention .  The patient's history has been reviewed, patient examined, no change in status, stable for surgery.  I have reviewed the patient's chart and labs.  Questions were answered to the patient's satisfaction.     Mauri Pole

## 2016-09-18 NOTE — Anesthesia Procedure Notes (Signed)
Spinal  Patient location during procedure: OR Start time: 09/18/2016 8:29 AM End time: 09/18/2016 8:33 AM Staffing Anesthesiologist: Lyndle Herrlich Resident/CRNA: Glory Buff Performed: resident/CRNA  Preanesthetic Checklist Completed: patient identified, site marked, surgical consent, pre-op evaluation, timeout performed, IV checked, risks and benefits discussed and monitors and equipment checked Spinal Block Patient position: sitting Prep: DuraPrep Patient monitoring: heart rate, continuous pulse ox and blood pressure Approach: right paramedian Location: L3-4 Injection technique: single-shot Needle Needle type: Sprotte  Needle gauge: 24 G Needle length: 10 cm Needle insertion depth: 5 cm Assessment Sensory level: T6 Additional Notes Kit expiration date checked.  Sitting position, sterile prep and drape, 1% xylo local infiltration @ R paramedian L3-4, 24g sprotte x 1 stick, +CSF pre and post injection,-heme, - paraesthesia, patient tolerated well.  Patient placed in R lateral decubitus x 3 minutes.

## 2016-09-18 NOTE — Op Note (Signed)
NAME:  Joseph Whitaker                      MEDICAL RECORD NO.:  [id number]                             FACILITY:  [facility]      PHYSICIAN:  Pietro Cassis. Alvan Dame, M.D.  DATE OF BIRTH:  13-Feb-1946      DATE OF PROCEDURE:  09/18/2016                                     OPERATIVE REPORT         PREOPERATIVE DIAGNOSIS:  Right knee osteoarthritis.      POSTOPERATIVE DIAGNOSIS:  Right knee osteoarthritis.      FINDINGS:  The patient was noted to have complete loss of cartilage and   bone-on-bone arthritis with associated osteophytes in the medial and patellofemoral compartments of   the knee with a significant synovitis and associated effusion.      PROCEDURE:  Right total knee replacement.      COMPONENTS USED:  DePuy Attune rotating platform posterior stabilized knee   system, a size 5 femur, 6 tibia, size 5 PS AOX insert, and 41 anatomic patellar   button.      SURGEON:  Pietro Cassis. Alvan Dame, M.D.      ASSISTANT:  Nehemiah Massed, PA-C.      ANESTHESIA:  Regional and Spinal.      SPECIMENS:  None.      COMPLICATION:  None.      DRAINS:  None.  EBL: <100cc      TOURNIQUET TIME:   Total Tourniquet Time Documented: Thigh (Right) - 26 minutes Total: Thigh (Right) - 26 minutes  .      The patient was stable to the recovery room.      INDICATION FOR PROCEDURE:  Joseph Whitaker is a 71 y.o. male patient of   mine.  The patient had been seen, evaluated, and treated conservatively in the   office with medication, activity modification, and injections.  The patient had   radiographic changes of bone-on-bone arthritis with endplate sclerosis and osteophytes noted.      The patient failed conservative measures including medication, injections, and activity modification, and at this point was ready for more definitive measures.   Based on the radiographic changes and failed conservative measures, the patient   decided to proceed with total knee replacement.  Risks of infection,   DVT,  component failure, need for revision surgery, postop course, and   expectations were all   discussed and reviewed.  Consent was obtained for benefit of pain   relief.      PROCEDURE IN DETAIL:  The patient was brought to the operative theater.   Once adequate anesthesia, preoperative antibiotics, 2 gm of Ancef, 1 gm of Tranexamic Acid, and 10 mg of Decaron administered, the patient was positioned supine with the right thigh tourniquet placed.  The  right lower extremity was prepped and draped in sterile fashion.  A time-   out was performed identifying the patient, planned procedure, and   extremity.      The right lower extremity was placed in the [facility] leg holder.  The leg was   exsanguinated, tourniquet elevated to 250 mmHg.  A midline incision was  made followed by median parapatellar arthrotomy.  Following initial   exposure, attention was first directed to the patella.  Precut   measurement was noted to be 25 mm.  I resected down to 14 mm and used a   41 anatomic patellar button to restore patellar height as well as cover the cut   surface.      The lug holes were drilled and a metal shim was placed to protect the   patella from retractors and saw blades.      At this point, attention was now directed to the femur.  The femoral   canal was opened with a drill, irrigated to try to prevent fat emboli.  An   intramedullary rod was passed at 5 degrees valgus, 9 mm of bone was   resected off the distal femur.  Following this resection, the tibia was   subluxated anteriorly.  Using the extramedullary guide, 2 mm of bone was resected off   the proximal medial tibia.  We confirmed the gap would be   stable medially and laterally with a size 5 spacer block as well as confirmed   the cut was perpendicular in the coronal plane, checking with an alignment rod.      Once this was done, I sized the femur to be a size 5 in the anterior-   posterior dimension, chose a standard component based  on medial and   lateral dimension.  The size 5 rotation block was then pinned in   position anterior referenced using the C-clamp to set rotation.  The   anterior, posterior, and  chamfer cuts were made without difficulty nor   notching making certain that I was along the anterior cortex to help   with flexion gap stability.      The final box cut was made off the lateral aspect of distal femur.      At this point, the tibia was sized to be a size 6, the size 6 tray was   then pinned in position through the medial third of the tubercle,   drilled, and keel punched.  Trial reduction was now carried with a 5 femur,  6 tibia, a size 5 PS insert, and the 41 anatomic patella botton.  The knee was brought to   extension, full extension with good flexion stability with the patella   tracking through the trochlea without application of pressure.  Given   all these findings the femoral lug holes were drilled and then the trial components removed.  Final components were   opened and cement was mixed.  The knee was irrigated with normal saline   solution and pulse lavage.  The synovial lining was   then injected with 30 cc 0.25% Marcaine without epinephrine and 1 cc of Toradol plus 30 cc of NS for a total of 61 cc.      The knee was irrigated.  Final implants were then cemented onto clean and   dried cut surfaces of bone with the knee brought to extension with a size 5 PS mm trial insert.      Once the cement had fully cured, the excess cement was removed   throughout the knee.  I confirmed I was satisfied with the range of   motion and stability, and the final size 5 PS AOX insert was chosen.  It was   placed into the knee.      The tourniquet had been let down at 26 minutes.  No significant   hemostasis required.  The   extensor mechanism was then reapproximated using #1 Vicryl and #0 V-lock sutures with the knee   in flexion.  The   remaining wound was closed with 2-0 Vicryl and running 4-0  Monocryl.   The knee was cleaned, dried, dressed sterilely using Dermabond and   Aquacel dressing.  The patient was then   brought to recovery room in stable condition, tolerating the procedure   well.   Please note that Physician Assistant, Nehemiah Massed, PA-C, was present for the entirety of the case, and was utilized for pre-operative positioning, peri-operative retractor management, general facilitation of the procedure.  He was also utilized for primary wound closure at the end of the case.              Pietro Cassis Alvan Dame, M.D.    09/18/2016 9:47 AM

## 2016-09-19 LAB — BASIC METABOLIC PANEL
ANION GAP: 5 (ref 5–15)
BUN: 25 mg/dL — ABNORMAL HIGH (ref 6–20)
CO2: 26 mmol/L (ref 22–32)
Calcium: 8.5 mg/dL — ABNORMAL LOW (ref 8.9–10.3)
Chloride: 109 mmol/L (ref 101–111)
Creatinine, Ser: 0.83 mg/dL (ref 0.61–1.24)
GFR calc Af Amer: >60 mL/min (ref >60)
Glucose, Bld: 128 mg/dL — ABNORMAL HIGH (ref 65–99)
POTASSIUM: 4.2 mmol/L (ref 3.5–5.1)
SODIUM: 140 mmol/L (ref 135–145)

## 2016-09-19 LAB — CBC
HCT: 30.8 % — ABNORMAL LOW (ref 39.0–52.0)
Hemoglobin: 10.5 g/dL — ABNORMAL LOW (ref 13.0–17.0)
MCH: 30.9 pg (ref 26.0–34.0)
MCHC: 34.1 g/dL (ref 30.0–36.0)
MCV: 90.6 fL (ref 78.0–100.0)
PLATELETS: 220 10*3/uL (ref 150–400)
RBC: 3.4 MIL/uL — AB (ref 4.22–5.81)
RDW: 13.1 % (ref 11.5–15.5)
WBC: 9.5 10*3/uL (ref 4.0–10.5)

## 2016-09-19 MED ORDER — HYDROCODONE-ACETAMINOPHEN 7.5-325 MG PO TABS: 1.0000 | ORAL_TABLET | ORAL | 0 refills | Status: DC | PRN

## 2016-09-19 MED ORDER — POLYETHYLENE GLYCOL 3350 17 G PO PACK: 17.0000 g | PACK | Freq: Two times a day (BID) | ORAL | 0 refills | Status: DC

## 2016-09-19 MED ORDER — DOCUSATE SODIUM 100 MG PO CAPS: 100.0000 mg | ORAL_CAPSULE | Freq: Two times a day (BID) | ORAL | 0 refills | Status: DC

## 2016-09-19 MED ORDER — ASPIRIN 81 MG PO CHEW: 81.0000 mg | CHEWABLE_TABLET | Freq: Two times a day (BID) | ORAL | 0 refills | Status: AC

## 2016-09-19 MED ORDER — FERROUS SULFATE 325 (65 FE) MG PO TABS: 325.0000 mg | ORAL_TABLET | Freq: Three times a day (TID) | ORAL | 3 refills | Status: DC

## 2016-09-19 MED ORDER — METHOCARBAMOL 500 MG PO TABS: 500.0000 mg | ORAL_TABLET | Freq: Four times a day (QID) | ORAL | 0 refills | Status: DC | PRN

## 2016-09-19 NOTE — Progress Notes (Signed)
   09/19/16 1300  PT Visit Information  Last PT Received On 09/19/16  Assistance Needed +1  History of Present Illness s/p R TKA  Subjective Data  Patient Stated Goal return to independence  Precautions  Precautions Fall;Knee  Restrictions  Other Position/Activity Restrictions WBAT  Pain Assessment  Pain Assessment 0-10  Pain Score 4  Pain Location R knee  Pain Descriptors / Indicators Aching;Sore  Pain Intervention(s) Limited activity within patient's tolerance;Monitored during session;Patient requesting pain meds-RN notified  Cognition  Arousal/Alertness Awake/alert  Behavior During Therapy WFL for tasks assessed/performed  Overall Cognitive Status Within Functional Limits for tasks assessed  Bed Mobility  Overal bed mobility Needs Assistance  Bed Mobility Supine to Sit;Sit to Supine  Supine to sit Supervision  Sit to supine Supervision  General bed mobility comments for safety, cues for technique, incr time  Transfers  Overall transfer level Needs assistance  Equipment used Rolling walker (2 wheeled)  Transfers Sit to/from Stand  Sit to Stand Min guard  General transfer comment cues for hand placement  Ambulation/Gait  Ambulation/Gait assistance Min guard;Supervision  Ambulation Distance (Feet) 50 Feet  Assistive device Rolling walker (2 wheeled)  Gait Pattern/deviations Step-through pattern;Step-to pattern  General Gait Details cues for sequence and RW position, use of UEs for pain control right knee  Stairs Yes  Stairs assistance Min guard  Stair Management One rail Right;One rail Left;Step to pattern;Sideways;Forwards  Number of Stairs 4  General stair comments cues for sequence and technique  PT - End of Session  Equipment Utilized During Treatment Gait belt  Activity Tolerance Patient tolerated treatment well  Patient left in bed;with call bell/phone within reach;with family/visitor present  PT - Assessment/Plan  PT Plan Current plan remains appropriate  PT  Frequency (ACUTE ONLY) 7X/week  Follow Up Recommendations Outpatient PT  PT equipment None recommended by PT  PT Goal Progression  Progress towards PT goals Progressing toward goals  Acute Rehab PT Goals  PT Goal Formulation With patient  Time For Goal Achievement 09/25/16  Potential to Achieve Goals Good  PT Time Calculation  PT Start Time (ACUTE ONLY) 1215  PT Stop Time (ACUTE ONLY) 1234  PT Time Calculation (min) (ACUTE ONLY) 19 min  PT General Charges  $$ ACUTE PT VISIT 1 Procedure  PT Treatments  $Gait Training 8-22 mins

## 2016-09-19 NOTE — Progress Notes (Signed)
     Subjective: 1 Day Post-Op Procedure(s) (LRB): Right TOTAL KNEE ARTHROPLASTY (Right)   Patient reports pain as mild, pain controlled.  No events throughout the night.  Feels that he worked well with PT yesterday.  Ready to be discharged home.  Objective:   VITALS:   Vitals:   09/19/16 0153 09/19/16 0545  BP: 111/67 117/67  Pulse: 77 70  Resp: 20 16  Temp: 98 F (36.7 C) 98.1 F (36.7 C)    Dorsiflexion/Plantar flexion intact Incision: dressing C/D/I No cellulitis present Compartment soft  LABS  Recent Labs  09/19/16 0437  HGB 10.5*  HCT 30.8*  WBC 9.5  PLT 220     Recent Labs  09/19/16 0437  NA 140  K 4.2  BUN 25*  CREATININE 0.83  GLUCOSE 128*     Assessment/Plan: 1 Day Post-Op Procedure(s) (LRB): Right TOTAL KNEE ARTHROPLASTY (Right) Foley cath d/c'ed Advance diet Up with therapy D/C IV fluids Discharge home Follow up in 2 weeks at [facility] Follow up with OLIN,Mallisa Alameda D in 2 weeks.  Contact information:  [facility] [address], [place] [id number]    Overweight (BMI 25-29.9) Estimated body mass index is 26.76 kg/m as calculated from the following:   Height as of this encounter: 5\' 8"  (1.727 m).   Weight as of this encounter: 79.8 kg (176 lb). Patient also counseled that weight may inhibit the healing process Patient counseled that losing weight will help with future health issues       Joseph Whitaker. Joseph Whitaker   PAC  09/19/2016, 9:39 AM

## 2016-09-19 NOTE — Evaluation (Signed)
   Occupational Therapy Evaluation Patient Details Name: Joseph Whitaker MRN: [id number] DOB: 11-06-1945 Today's Date: 2016/10/19    History of Present Illness s/p R TKA   Clinical Impression   OT education complete regarding ADL activity s/p TKR    Follow Up Recommendations  No OT follow up    Equipment Recommendations  None recommended by OT    Recommendations for Other Services       Precautions / Restrictions Precautions Precautions: Fall;Knee Restrictions Other Position/Activity Restrictions: WBAT      Mobility Bed Mobility Overal bed mobility: Needs Assistance Bed Mobility: Supine to Sit     Supine to sit: Supervision        Transfers Overall transfer level: Needs assistance Equipment used: Rolling walker (2 wheeled) Transfers: Sit to/from Stand Sit to Stand: Min guard         General transfer comment: cues for hand placement         ADL Overall ADL's : Needs assistance/impaired                                     Functional mobility during ADLs: Min guard General ADL Comments: Pt overall S- min A with ADL activity. Wife will A as needed. Education complete regarding ADL activity s/p TKR               Pertinent Vitals/Pain Pain Assessment: 0-10 Pain Score: 2  Pain Descriptors / Indicators: Aching;Sore Pain Intervention(s): Monitored during session     Hand Dominance     Extremity/Trunk Assessment Upper Extremity Assessment Upper Extremity Assessment: Overall WFL for tasks assessed           Communication Communication Communication: No difficulties   Cognition Arousal/Alertness: Awake/alert Behavior During Therapy: WFL for tasks assessed/performed Overall Cognitive Status: Within Functional Limits for tasks assessed                                Home Living Family/patient expects to be discharged to:: Private residence Living Arrangements: Spouse/significant other Available Help at  Discharge: Family Type of Home: House Home Access: Stairs to enter Technical brewer of Steps: 4   Home Layout: One level     Bathroom Shower/Tub: Occupational psychologist: Handicapped height     Home Equipment: Environmental consultant - 2 wheels;Bedside commode          Prior Functioning/Environment Level of Independence: Independent                       OT Goals(Current goals can be found in the care plan section) Acute Rehab OT Goals Patient Stated Goal: return to independence  OT Frequency:                End of Session Equipment Utilized During Treatment: Rolling walker Nurse Communication: Mobility status  Activity Tolerance: Patient tolerated treatment well Patient left: in bed;with call bell/phone within reach;with family/visitor present   Time: [id number] OT Time Calculation (min): 10 min Charges:  OT General Charges $OT Visit: 1 Procedure OT Evaluation $OT Eval Low Complexity: 1 Procedure G-Codes:    Payton Mccallum D October 19, 2016, 11:45 AM

## 2016-09-19 NOTE — Progress Notes (Signed)
Physical Therapy Treatment Patient Details Name: Joseph Whitaker MRN: [id number] DOB: 1946/03/01 Today's Date: 09/19/2016    History of Present Illness s/p R TKA    PT Comments    Pt progressing, will perform stairs later  Follow Up Recommendations  Outpatient PT     Equipment Recommendations  None recommended by PT    Recommendations for Other Services       Precautions / Restrictions Precautions Precautions: Fall;Knee Restrictions Weight Bearing Restrictions: No Other Position/Activity Restrictions: WBAT    Mobility  Bed Mobility Overal bed mobility: Needs Assistance Bed Mobility: Supine to Sit;Sit to Supine     Supine to sit: Supervision Sit to supine: Supervision   General bed mobility comments: for safety, cues for technique, incr time  Transfers Overall transfer level: Needs assistance Equipment used: Rolling walker (2 wheeled) Transfers: Sit to/from Stand Sit to Stand: Min guard         General transfer comment: cues for hand placement  Ambulation/Gait Ambulation/Gait assistance: Min guard;Supervision Ambulation Distance (Feet): 130 Feet Assistive device: Rolling walker (2 wheeled) Gait Pattern/deviations: Step-through pattern;Step-to pattern     General Gait Details: cues for sequence and RW position, use of UEs for pain control right knee   Stairs            Wheelchair Mobility    Modified Rankin (Stroke Patients Only)       Balance                                    Cognition Arousal/Alertness: Awake/alert Behavior During Therapy: WFL for tasks assessed/performed Overall Cognitive Status: Within Functional Limits for tasks assessed                      Exercises Total Joint Exercises Ankle Circles/Pumps: AROM;Both;10 reps Quad Sets: 10 reps;Both;AROM Heel Slides: AAROM;Both;10 reps Hip ABduction/ADduction: AAROM;Right;5 reps Goniometric ROM: ~10 to 60*    General Comments        Pertinent  Vitals/Pain Pain Assessment: 0-10 Pain Score: 2  Pain Location: R knee Pain Descriptors / Indicators: Aching;Sore Pain Intervention(s): Limited activity within patient's tolerance;Monitored during session;Premedicated before session;Repositioned;Ice applied    Home Living Family/patient expects to be discharged to:: Private residence Living Arrangements: Spouse/significant other Available Help at Discharge: Family Type of Home: House Home Access: Stairs to enter   Home Layout: One level Home Equipment: Environmental consultant - 2 wheels;Bedside commode      Prior Function Level of Independence: Independent          PT Goals (current goals can now be found in the care plan section) Acute Rehab PT Goals Patient Stated Goal: return to independence PT Goal Formulation: With patient Time For Goal Achievement: 09/25/16 Potential to Achieve Goals: Good Progress towards PT goals: Progressing toward goals    Frequency    7X/week      PT Plan Current plan remains appropriate    Co-evaluation             End of Session Equipment Utilized During Treatment: Gait belt Activity Tolerance: Patient tolerated treatment well Patient left: in bed;with call bell/phone within reach;with family/visitor present     Time: [id number] PT Time Calculation (min) (ACUTE ONLY): 39 min  Charges:  $Gait Training: 23-37 mins $Therapeutic Exercise: 8-22 mins                    G Codes:  [facility] 09/19/2016, 12:57 PM

## 2016-09-21 DIAGNOSIS — Z7409 Other reduced mobility: Secondary | ICD-10-CM | POA: Diagnosis not present

## 2016-09-24 DIAGNOSIS — M1711 Unilateral primary osteoarthritis, right knee: Secondary | ICD-10-CM | POA: Diagnosis not present

## 2016-09-25 NOTE — Discharge Summary (Signed)
Physician Discharge Summary  Patient ID: Joseph Whitaker MRN: [id number] DOB/AGE: [birth year] 71 y.o.  Admit date: 09/18/2016 Discharge date: 09/19/2016   Procedures:  Procedure(s) (LRB): Right TOTAL KNEE ARTHROPLASTY (Right)  Attending Physician:  Dr. Paralee Cancel   Admission Diagnoses:   Right knee primary OA / pain  Discharge Diagnoses:  Principal Problem:   S/P right TKA Active Problems:   S/P knee replacement  Past Medical History:  Diagnosis Date  . Arthritis   . Hypertension   . Hypothyroidism     HPI:    Joseph Whitaker, 71 y.o. male, has a history of pain and functional disability in the right knee due to arthritis and has failed non-surgical conservative treatments for greater than 12 weeks to includeNSAID's and/or analgesics, corticosteriod injections and activity modification.  Onset of symptoms was gradual, starting 2+ years ago with gradually worsening course since that time. The patient noted no past surgery on the right knee(s).  Patient currently rates pain in the right knee(s) at 8 out of 10 with activity. Patient has worsening of pain with activity and weight bearing, pain that interferes with activities of daily living, pain with passive range of motion, crepitus and joint swelling.  Patient has evidence of periarticular osteophytes and joint space narrowing by imaging studies.  There is no active infection.  Risks, benefits and expectations were discussed with the patient.  Risks including but not limited to the risk of anesthesia, blood clots, nerve damage, blood vessel damage, failure of the prosthesis, infection and up to and including death.  Patient understand the risks, benefits and expectations and wishes to proceed with surgery.   PCP: Vernie Murders, PA   Discharged Condition: good  Hospital Course:  Patient underwent the above stated procedure on 09/18/2016. Patient tolerated the procedure well and brought to the recovery room in good condition and  subsequently to the floor.  POD #1 BP: 117/67 ; Pulse: 70 ; Temp: 98.1 F (36.7 C) ; Resp: 16 Patient reports pain as mild, pain controlled.  No events throughout the night.  Feels that he worked well with PT yesterday.  Ready to be discharged home. Dorsiflexion/plantar flexion intact, incision: dressing C/D/I, no cellulitis present and compartment soft.   LABS  Basename    HGB     10.5  HCT     30.8    Discharge Exam: General appearance: alert, cooperative and no distress Extremities: Homans sign is negative, no sign of DVT, no edema, redness or tenderness in the calves or thighs and no ulcers, gangrene or trophic changes  Disposition: Home with follow up in 2 weeks   Follow-up Information    Mauri Pole, MD. Schedule an appointment as soon as possible for a visit in 2 week(s).   Specialty:  Orthopedic Surgery Contact information: [address] [place] [address] [id number]           Discharge Instructions    Call MD / Call 911    Complete by:  As directed    If you experience chest pain or shortness of breath, CALL 911 and be transported to the hospital emergency room.  If you develope a fever above 101 F, pus (white drainage) or increased drainage or redness at the wound, or calf pain, call your surgeon's office.   Change dressing    Complete by:  As directed    Maintain surgical dressing until follow up in the clinic. If the edges start to pull up, may reinforce  with tape. If the dressing is no longer working, may remove and cover with gauze and tape, but must keep the area dry and clean.  Call with any questions or concerns.   Constipation Prevention    Complete by:  As directed    Drink plenty of fluids.  Prune juice may be helpful.  You may use a stool softener, such as Colace (over the counter) 100 mg twice a day.  Use MiraLax (over the counter) for constipation as needed.   Diet - low sodium heart healthy    Complete by:  As directed     Discharge instructions    Complete by:  As directed    Maintain surgical dressing until follow up in the clinic. If the edges start to pull up, may reinforce with tape. If the dressing is no longer working, may remove and cover with gauze and tape, but must keep the area dry and clean.  Follow up in 2 weeks at [facility]. Call with any questions or concerns.   Increase activity slowly as tolerated    Complete by:  As directed    Weight bearing as tolerated with assist device (walker, cane, etc) as directed, use it as long as suggested by your surgeon or therapist, typically at least 4-6 weeks.   TED hose    Complete by:  As directed    Use stockings (TED hose) for 2 weeks on both leg(s).  You may remove them at night for sleeping.      Allergies as of 09/19/2016   No Known Allergies     Medication List    TAKE these medications   amLODipine 5 MG tablet Commonly known as:  NORVASC Take 1 tablet (5 mg total) by mouth daily.   aspirin 81 MG chewable tablet Chew 1 tablet (81 mg total) by mouth 2 (two) times daily. Take for 4 weeks.   CO Q 10 PO Take 1 capsule by mouth daily.   docusate sodium 100 MG capsule Commonly known as:  COLACE Take 1 capsule (100 mg total) by mouth 2 (two) times daily.   ferrous sulfate 325 (65 FE) MG tablet Take 1 tablet (325 mg total) by mouth 3 (three) times daily after meals.   HYDROcodone-acetaminophen 7.5-325 MG tablet Commonly known as:  NORCO Take 1-2 tablets by mouth every 4 (four) hours as needed for moderate pain.   levothyroxine 25 MCG tablet Commonly known as:  SYNTHROID, LEVOTHROID Take 1 tablet (25 mcg total) by mouth daily.   MAGNESIUM PO Take 1 tablet by mouth at bedtime.   MELATONIN PO Take 1 tablet by mouth at bedtime as needed (for sleep.).   methocarbamol 500 MG tablet Commonly known as:  ROBAXIN Take 1 tablet (500 mg total) by mouth every 6 (six) hours as needed for muscle spasms.   polyethylene glycol  packet Commonly known as:  MIRALAX / GLYCOLAX Take 17 g by mouth 2 (two) times daily.        Signed: West Pugh. Day Greb   PA-C  09/25/2016, 9:40 AM

## 2016-09-26 DIAGNOSIS — M1711 Unilateral primary osteoarthritis, right knee: Secondary | ICD-10-CM | POA: Diagnosis not present

## 2016-09-28 DIAGNOSIS — M1711 Unilateral primary osteoarthritis, right knee: Secondary | ICD-10-CM | POA: Diagnosis not present

## 2016-10-01 DIAGNOSIS — M1711 Unilateral primary osteoarthritis, right knee: Secondary | ICD-10-CM | POA: Diagnosis not present

## 2016-10-03 DIAGNOSIS — Z96651 Presence of right artificial knee joint: Secondary | ICD-10-CM | POA: Diagnosis not present

## 2016-10-03 DIAGNOSIS — Z471 Aftercare following joint replacement surgery: Secondary | ICD-10-CM | POA: Diagnosis not present

## 2016-10-03 DIAGNOSIS — M1711 Unilateral primary osteoarthritis, right knee: Secondary | ICD-10-CM | POA: Diagnosis not present

## 2016-10-05 DIAGNOSIS — M1711 Unilateral primary osteoarthritis, right knee: Secondary | ICD-10-CM | POA: Diagnosis not present

## 2016-10-08 DIAGNOSIS — M1711 Unilateral primary osteoarthritis, right knee: Secondary | ICD-10-CM | POA: Diagnosis not present

## 2016-10-10 DIAGNOSIS — M1711 Unilateral primary osteoarthritis, right knee: Secondary | ICD-10-CM | POA: Diagnosis not present

## 2016-10-12 DIAGNOSIS — M1711 Unilateral primary osteoarthritis, right knee: Secondary | ICD-10-CM | POA: Diagnosis not present

## 2016-10-15 DIAGNOSIS — M1711 Unilateral primary osteoarthritis, right knee: Secondary | ICD-10-CM | POA: Diagnosis not present

## 2016-10-17 DIAGNOSIS — M1711 Unilateral primary osteoarthritis, right knee: Secondary | ICD-10-CM | POA: Diagnosis not present

## 2016-10-19 DIAGNOSIS — M1711 Unilateral primary osteoarthritis, right knee: Secondary | ICD-10-CM | POA: Diagnosis not present

## 2016-10-22 DIAGNOSIS — M1711 Unilateral primary osteoarthritis, right knee: Secondary | ICD-10-CM | POA: Diagnosis not present

## 2016-10-24 DIAGNOSIS — M1711 Unilateral primary osteoarthritis, right knee: Secondary | ICD-10-CM | POA: Diagnosis not present

## 2016-10-26 DIAGNOSIS — M1711 Unilateral primary osteoarthritis, right knee: Secondary | ICD-10-CM | POA: Diagnosis not present

## 2016-10-31 DIAGNOSIS — Z471 Aftercare following joint replacement surgery: Secondary | ICD-10-CM | POA: Diagnosis not present

## 2016-10-31 DIAGNOSIS — M1711 Unilateral primary osteoarthritis, right knee: Secondary | ICD-10-CM | POA: Diagnosis not present

## 2016-10-31 DIAGNOSIS — Z96651 Presence of right artificial knee joint: Secondary | ICD-10-CM | POA: Diagnosis not present

## 2016-11-01 ENCOUNTER — Telehealth: Payer: Self-pay | Admitting: Family Medicine

## 2016-11-01 NOTE — Telephone Encounter (Signed)
Called Pt to schedule AWV with [facility] - knb °

## 2016-11-12 ENCOUNTER — Telehealth: Payer: Self-pay | Admitting: Family Medicine

## 2016-11-12 ENCOUNTER — Other Ambulatory Visit: Payer: Self-pay | Admitting: Family Medicine

## 2016-11-12 DIAGNOSIS — I10 Essential (primary) hypertension: Secondary | ICD-10-CM

## 2016-11-12 MED ORDER — AMLODIPINE BESYLATE 5 MG PO TABS: 5.0000 mg | ORAL_TABLET | Freq: Every day | ORAL | 3 refills | Status: DC

## 2016-11-12 NOTE — Telephone Encounter (Signed)
Pt contacted office for refill request on the following medications: amLODipine (NORVASC) 5 MG tablet  [facility] [address] Pt is scheduled for CPE on 12/20/16 and needs refills before the appt. Tried to schedule for AWV but pt stated he only wanted to see Simona Huh. Please advise. Thanks TNP

## 2016-12-12 ENCOUNTER — Other Ambulatory Visit: Payer: Self-pay | Admitting: Family Medicine

## 2016-12-12 NOTE — Telephone Encounter (Signed)
Pt needs refill on his   levothyroxine (SYNTHROID, LEVOTHROID) 25 MCG tablet  [facility] [address]  Thanks Con Memos

## 2016-12-13 MED ORDER — LEVOTHYROXINE SODIUM 25 MCG PO TABS: 25.0000 ug | ORAL_TABLET | Freq: Every day | ORAL | 3 refills | Status: DC

## 2016-12-14 ENCOUNTER — Telehealth: Payer: Self-pay | Admitting: Family Medicine

## 2016-12-14 NOTE — Telephone Encounter (Signed)
Called Pt to schedule AWV with [facility] move to 4/20? knb

## 2016-12-20 ENCOUNTER — Ambulatory Visit (INDEPENDENT_AMBULATORY_CARE_PROVIDER_SITE_OTHER): Payer: Medicare Other | Admitting: Family Medicine

## 2016-12-20 ENCOUNTER — Encounter: Payer: Self-pay | Admitting: Family Medicine

## 2016-12-20 VITALS — BP 128/84 | HR 75 | Temp 97.4°F | Ht 68.5 in | Wt 183.0 lb

## 2016-12-20 DIAGNOSIS — Z0000 Encounter for general adult medical examination without abnormal findings: Secondary | ICD-10-CM

## 2016-12-20 DIAGNOSIS — E039 Hypothyroidism, unspecified: Secondary | ICD-10-CM

## 2016-12-20 DIAGNOSIS — N400 Enlarged prostate without lower urinary tract symptoms: Secondary | ICD-10-CM | POA: Diagnosis not present

## 2016-12-20 DIAGNOSIS — E7800 Pure hypercholesterolemia, unspecified: Secondary | ICD-10-CM | POA: Diagnosis not present

## 2016-12-20 DIAGNOSIS — I10 Essential (primary) hypertension: Secondary | ICD-10-CM

## 2016-12-20 DIAGNOSIS — Z96651 Presence of right artificial knee joint: Secondary | ICD-10-CM | POA: Diagnosis not present

## 2016-12-20 NOTE — Patient Instructions (Signed)
 Health Maintenance, Male A healthy lifestyle and preventive care is important for your health and wellness. Ask your health care provider about what schedule of regular examinations is right for you. What should I know about weight and diet?  Eat a Healthy Diet  Eat plenty of vegetables, fruits, whole grains, low-fat dairy products, and lean protein.  Do not eat a lot of foods high in solid fats, added sugars, or salt. Maintain a Healthy Weight  Regular exercise can help you achieve or maintain a healthy weight. You should:  Do at least 150 minutes of exercise each week. The exercise should increase your heart rate and make you sweat (moderate-intensity exercise).  Do strength-training exercises at least twice a week. Watch Your Levels of Cholesterol and Blood Lipids  Have your blood tested for lipids and cholesterol every 5 years starting at 70 years of age. If you are at high risk for heart disease, you should start having your blood tested when you are 70 years old. You may need to have your cholesterol levels checked more often if:  Your lipid or cholesterol levels are high.  You are older than 70 years of age.  You are at high risk for heart disease. What should I know about cancer screening? Many types of cancers can be detected early and may often be prevented. Lung Cancer  You should be screened every year for lung cancer if:  You are a current smoker who has smoked for at least 30 years.  You are a former smoker who has quit within the past 15 years.  Talk to your health care provider about your screening options, when you should start screening, and how often you should be screened. Colorectal Cancer  Routine colorectal cancer screening usually begins at 70 years of age and should be repeated every 5-10 years until you are 70 years old. You may need to be screened more often if early forms of precancerous polyps or small growths are found. Your health care provider  may recommend screening at an earlier age if you have risk factors for colon cancer.  Your health care provider may recommend using home test kits to check for hidden blood in the stool.  A small camera at the end of a tube can be used to examine your colon (sigmoidoscopy or colonoscopy). This checks for the earliest forms of colorectal cancer. Prostate and Testicular Cancer  Depending on your age and overall health, your health care provider may do certain tests to screen for prostate and testicular cancer.  Talk to your health care provider about any symptoms or concerns you have about testicular or prostate cancer. Skin Cancer  Check your skin from head to toe regularly.  Tell your health care provider about any new moles or changes in moles, especially if:  There is a change in a mole's size, shape, or color.  You have a mole that is larger than a pencil eraser.  Always use sunscreen. Apply sunscreen liberally and repeat throughout the day.  Protect yourself by wearing long sleeves, pants, a wide-brimmed hat, and sunglasses when outside. What should I know about heart disease, diabetes, and high blood pressure?  If you are 18-39 years of age, have your blood pressure checked every 3-5 years. If you are 40 years of age or older, have your blood pressure checked every year. You should have your blood pressure measured twice-once when you are at a hospital or clinic, and once when you are not at   a hospital or clinic. Record the average of the two measurements. To check your blood pressure when you are not at a hospital or clinic, you can use:  An automated blood pressure machine at a pharmacy.  A home blood pressure monitor.  Talk to your health care provider about your target blood pressure.  If you are between 45-79 years old, ask your health care provider if you should take aspirin to prevent heart disease.  Have regular diabetes screenings by checking your fasting blood sugar  level.  If you are at a normal weight and have a low risk for diabetes, have this test once every three years after the age of 45.  If you are overweight and have a high risk for diabetes, consider being tested at a younger age or more often.  A one-time screening for abdominal aortic aneurysm (AAA) by ultrasound is recommended for men aged 65-75 years who are current or former smokers. What should I know about preventing infection? Hepatitis B  If you have a higher risk for hepatitis B, you should be screened for this virus. Talk with your health care provider to find out if you are at risk for hepatitis B infection. Hepatitis C  Blood testing is recommended for:  Everyone born from 1945 through 1965.  Anyone with known risk factors for hepatitis C. Sexually Transmitted Diseases (STDs)  You should be screened each year for STDs including gonorrhea and chlamydia if:  You are sexually active and are younger than 70 years of age.  You are older than 70 years of age and your health care provider tells you that you are at risk for this type of infection.  Your sexual activity has changed since you were last screened and you are at an increased risk for chlamydia or gonorrhea. Ask your health care provider if you are at risk.  Talk with your health care provider about whether you are at high risk of being infected with HIV. Your health care provider may recommend a prescription medicine to help prevent HIV infection. What else can I do?  Schedule regular health, dental, and eye exams.  Stay current with your vaccines (immunizations).  Do not use any tobacco products, such as cigarettes, chewing tobacco, and e-cigarettes. If you need help quitting, ask your health care provider.  Limit alcohol intake to no more than 2 drinks per day. One drink equals 12 ounces of beer, 5 ounces of wine, or 1 ounces of hard liquor.  Do not use street drugs.  Do not share needles.  Ask your health  care provider for help if you need support or information about quitting drugs.  Tell your health care provider if you often feel depressed.  Tell your health care provider if you have ever been abused or do not feel safe at home. This information is not intended to replace advice given to you by your health care provider. Make sure you discuss any questions you have with your health care provider. Document Released: 02/16/2008 Document Revised: 04/18/2016 Document Reviewed: 05/24/2015 Elsevier Interactive Patient Education  2017 Elsevier Inc.  

## 2016-12-20 NOTE — Progress Notes (Signed)
Patient: Joseph Whitaker, Male    DOB: 03/09/1946, 71 y.o.   MRN: [id number] Visit Date: 12/20/2016  Today's Provider: Vernie Murders, PA   Chief Complaint  Patient presents with  . Medicare Wellness   Subjective:    Annual wellness visit Joseph Whitaker is a 71 y.o. male who presents today for his Subsequent Annual Wellness Visit. He feels well. 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 He reports he is sleeping fair.  ----------------------------------------------------------- Tdap: 04/04/2009 Zoster: 07/02/2008 Prevnar: declined Pneumovax: declined Colonoscopy: 11/22/2010   Review of Systems  Constitutional: Negative.   HENT: Positive for tinnitus.   Eyes: Negative.   Respiratory: Negative.   Cardiovascular: Negative.   Gastrointestinal: Negative.   Endocrine: Negative.   Genitourinary: Negative.   Musculoskeletal: Negative.   Skin: Negative.   Allergic/Immunologic: Negative.   Neurological: Negative.   Hematological: Negative.   Psychiatric/Behavioral: Negative.     Social History   Social History  . Marital status: Married    Spouse name: N/A  . Number of children: N/A  . Years of education: N/A   Occupational History  . Not on file.   Social History Main Topics  . Smoking status: Former Smoker    Quit date: 09/03/1985  . Smokeless tobacco: Never Used  . Alcohol use 0.6 oz/week    1 Cans of beer per week     Comment: occasionally  . Drug use: No  . Sexual activity: Not on file   Other Topics Concern  . Not on file   Social History Narrative  . No narrative on file    Patient Active Problem List   Diagnosis Date Noted  . S/P right TKA 09/18/2016  . S/P knee replacement 09/18/2016  . Lesion of penis 11/23/2015  . Pneumococcal vaccination declined 11/23/2015  . Cannot sleep 12/29/2009  . Excessive urination at night 07/02/2008  . Need for vaccination 07/02/2008  . CD (contact dermatitis) 01/19/2008  . Essential (primary) hypertension 06/16/2007   . HLD (hyperlipidemia) 08/10/2003    Past Surgical History:  Procedure Laterality Date  . NO PAST SURGERIES    . TOTAL KNEE ARTHROPLASTY Right 09/18/2016   Procedure: Right TOTAL KNEE ARTHROPLASTY;  Surgeon: Paralee Cancel, MD;  Location: [facility];  Service: Orthopedics;  Laterality: Right;  Adductor Block    His family history includes Arthritis in his brother; Colon cancer in his paternal grandfather; Dementia in his father; Heart attack in his brother; Heart disease in his maternal grandfather and maternal grandmother; Hyperlipidemia in his brother, brother, and sister.     Previous Medications   AMLODIPINE (NORVASC) 5 MG TABLET    Take 1 tablet (5 mg total) by mouth daily.   CELECOXIB (CELEBREX) 200 MG CAPSULE    Take 200 mg by mouth daily.   COENZYME Q10 (CO Q 10 PO)    Take 1 capsule by mouth daily.    DOCUSATE SODIUM (COLACE) 100 MG CAPSULE    Take 1 capsule (100 mg total) by mouth 2 (two) times daily.   FERROUS SULFATE 325 (65 FE) MG TABLET    Take 1 tablet (325 mg total) by mouth 3 (three) times daily after meals.   LEVOTHYROXINE (SYNTHROID, LEVOTHROID) 25 MCG TABLET    Take 1 tablet (25 mcg total) by mouth daily.   MAGNESIUM PO    Take 1 tablet by mouth at bedtime.     Patient Care Team: Margo Common, PA as PCP - General (Family Medicine)      Objective:  Vitals: BP 128/84 (BP Location: Right Arm, Patient Position: Sitting, Cuff Size: Normal)   Pulse 75   Temp 97.4 F (36.3 C) (Oral)   Ht 5' 8.5" (1.74 m)   Wt 183 lb (83 kg)   SpO2 99%   BMI 27.42 kg/m   Physical Exam  Constitutional: He is oriented to person, place, and time. He appears well-developed and well-nourished.  HENT:  Head: Normocephalic and atraumatic.  Right Ear: External ear normal.  Left Ear: External ear normal.  Nose: Nose normal.  Mouth/Throat: Oropharynx is clear and moist.  Eyes: Conjunctivae and EOM are normal. Pupils are equal, round, and reactive to light. Right eye exhibits no  discharge.  Neck: Normal range of motion. Neck supple. No tracheal deviation present. No thyromegaly present.  Cardiovascular: Normal rate, regular rhythm, normal heart sounds and intact distal pulses.   No murmur heard. Pulmonary/Chest: Effort normal and breath sounds normal. No respiratory distress. He has no wheezes. He has no rales. He exhibits no tenderness.  Abdominal: Soft. He exhibits no distension and no mass. There is no tenderness. There is no rebound and no guarding.  Genitourinary: Rectal exam shows guaiac negative stool. Prostate is enlarged. Prostate is not tender.  Musculoskeletal: Normal range of motion. He exhibits no edema or tenderness.  Right knee slightly enlarged and 10 degree loss of full extension and 20 degree loss of full flexion. Well healed scars from joint replacement.  Lymphadenopathy:    He has no cervical adenopathy.  Neurological: He is alert and oriented to person, place, and time. He has normal reflexes. No cranial nerve deficit. He exhibits normal muscle tone. Coordination normal.  Skin: Skin is warm and dry. No rash noted. No erythema.  Psychiatric: He has a normal mood and affect. His behavior is normal. Judgment and thought content normal.    Activities of Daily Living In your present state of health, do you have any difficulty performing the following activities: 12/20/2016 09/18/2016  Hearing? N N  Vision? N N  Difficulty concentrating or making decisions? N N  Walking or climbing stairs? N N  Dressing or bathing? N N  Doing errands, shopping? N N  Some recent data might be hidden    Fall Risk Assessment Fall Risk  12/20/2016 12/19/2015 12/19/2015  Falls in the past year? No No No     Depression Screen PHQ 2/9 Scores 12/20/2016 12/19/2015 12/19/2015  PHQ - 2 Score 0 0 0  PHQ- 9 Score 0 - -    Cognitive Testing - 6-CIT  Correct? Score   What year is it? yes 0 0 or 4  What month is it? yes 0 0 or 3  Memorize:    Joseph Whitaker,  43,  [place],      What time is it? (within 1 hour) yes 0 0 or 3  Count backwards from 20 yes 0 0, 2, or 4  Name the months of the year yes 0 0, 2, or 4  Repeat name & address above yes 4 0, 2, 4, 6, 8, or 10       TOTAL SCORE  4/28   Interpretation:  Normal  Normal (0-7) Abnormal (8-28)    Audit-C Alcohol Use Screening  Question Answer Points  How often do you have alcoholic drink? never 0  On days you do drink alcohol, how many drinks do you typically consume? 0 0  How oftey will you drink 6 or more in a total? never  0  Total Score:  0   A score of 3 or more in women, and 4 or more in men indicates increased risk for alcohol abuse, EXCEPT if all of the points are from question 1.     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      Immunization History  Administered Date(s) Administered  . Influenza Split 08/11/2009, 08/14/2010, 08/17/2011  . Influenza, High Dose Seasonal PF 08/06/2016  . Tdap 04/04/2009  . Zoster 07/02/2008    Health Maintenance  Topic Date Due  . PNA vac Low Risk Adult (1 of 2 - PCV13) 12/22/2011  . INFLUENZA VACCINE  04/03/2017  . TETANUS/TDAP  04/05/2019  . COLONOSCOPY  11/21/2020  . Hepatitis C Screening  Completed     Discussed health benefits of physical activity, and encouraged him to engage in regular exercise appropriate for his age and condition.    ------------------------------------------------------------------------------------------------------------  1. Annual physical  exam General health stable. Refuses pneumonia and shingles vaccinations. Tdap and colonoscopy up to date. Screenings complete as above. Recheck annually.  2. Essential (primary) hypertension Well controlled on the Amlodipine 5 mg qd without peripheral edema. Recheck labs. - CBC with Differential/Platelet - Comprehensive metabolic panel  3. Status post right knee replacement Had right knee replacement September 18, 2016 by Dr. Alvan Dame (orthopedist) in [place]. Some loss of flexion and extension but feeling better than prior to surgery. Still doing the rehab exercises. Follow up with Dr. Alvan Dame as planned.  4. Pure hypercholesterolemia Trying to follow a low fat diet. Total cholesterol 179, HDL 40, triglycerides 100 and LDL 119 on 08-13-16. Will recheck labs and continue present diet and exercise regimen. - Comprehensive metabolic panel - Lipid panel - TSH  5. Enlarged prostate on rectal examination Nocturia 1-2 times occasionally. DRE showed prostate 4+ enlarged. Patient denies significant urine flow obstruction. Will recheck PSA. - PSA  6. Hypothyroidism, unspecified type Tolerating Levothyroxine 25 mcg qd without tremor, palpitations, weight loss or hair loss. Recheck labs and continue present dosage. - CBC with Differential/Platelet - Comprehensive metabolic panel - TSH - T4

## 2016-12-21 ENCOUNTER — Telehealth: Payer: Self-pay | Admitting: Family Medicine

## 2016-12-21 ENCOUNTER — Telehealth: Payer: Self-pay

## 2016-12-21 LAB — CBC WITH DIFFERENTIAL/PLATELET
BASOS: 0 %
Basophils Absolute: 0 10*3/uL (ref 0.0–0.2)
EOS (ABSOLUTE): 0.1 10*3/uL (ref 0.0–0.4)
Eos: 1 %
Hematocrit: 39.1 % (ref 37.5–51.0)
Hemoglobin: 13.3 g/dL (ref 13.0–17.7)
IMMATURE GRANS (ABS): 0 10*3/uL (ref 0.0–0.1)
IMMATURE GRANULOCYTES: 0 %
LYMPHS: 27 %
Lymphocytes Absolute: 1.3 10*3/uL (ref 0.7–3.1)
MCH: 30.3 pg (ref 26.6–33.0)
MCHC: 34 g/dL (ref 31.5–35.7)
MCV: 89 fL (ref 79–97)
MONOS ABS: 0.6 10*3/uL (ref 0.1–0.9)
Monocytes: 12 %
NEUTROS PCT: 60 %
Neutrophils Absolute: 2.8 10*3/uL (ref 1.4–7.0)
PLATELETS: 335 10*3/uL (ref 150–379)
RBC: 4.39 x10E6/uL (ref 4.14–5.80)
RDW: 14 % (ref 12.3–15.4)
WBC: 4.7 10*3/uL (ref 3.4–10.8)

## 2016-12-21 LAB — COMPREHENSIVE METABOLIC PANEL
A/G RATIO: 1.8 (ref 1.2–2.2)
ALT: 13 IU/L (ref 0–44)
AST: 20 IU/L (ref 0–40)
Albumin: 4.4 g/dL (ref 3.6–4.8)
Alkaline Phosphatase: 69 IU/L (ref 39–117)
BUN/Creatinine Ratio: 23 (ref 10–24)
BUN: 19 mg/dL (ref 8–27)
Bilirubin Total: 0.4 mg/dL (ref 0.0–1.2)
CALCIUM: 9.6 mg/dL (ref 8.6–10.2)
CO2: 22 mmol/L (ref 18–29)
Chloride: 103 mmol/L (ref 96–106)
Creatinine, Ser: 0.81 mg/dL (ref 0.76–1.27)
GFR, EST AFRICAN AMERICAN: 105 mL/min/{1.73_m2} (ref >59)
GFR, EST NON AFRICAN AMERICAN: 91 mL/min/{1.73_m2} (ref >59)
GLUCOSE: 89 mg/dL (ref 65–99)
Globulin, Total: 2.4 g/dL (ref 1.5–4.5)
POTASSIUM: 4.3 mmol/L (ref 3.5–5.2)
Sodium: 142 mmol/L (ref 134–144)
TOTAL PROTEIN: 6.8 g/dL (ref 6.0–8.5)

## 2016-12-21 LAB — LIPID PANEL
CHOL/HDL RATIO: 4.5 ratio (ref 0.0–5.0)
Cholesterol, Total: 193 mg/dL (ref 100–199)
HDL: 43 mg/dL (ref >39)
LDL Calculated: 133 mg/dL — ABNORMAL HIGH (ref 0–99)
TRIGLYCERIDES: 85 mg/dL (ref 0–149)
VLDL CHOLESTEROL CAL: 17 mg/dL (ref 5–40)

## 2016-12-21 LAB — TSH: TSH: 3 u[IU]/mL (ref 0.450–4.500)

## 2016-12-21 LAB — PSA: PROSTATE SPECIFIC AG, SERUM: 1.8 ng/mL (ref 0.0–4.0)

## 2016-12-21 LAB — T4: T4, Total: 6.8 ug/dL (ref 4.5–12.0)

## 2016-12-21 NOTE — Telephone Encounter (Signed)
Pt returning call regarding labs.  Please call pt back with results.

## 2016-12-21 NOTE — Telephone Encounter (Signed)
-----   Message from Margo Common, [place] sent at 12/21/2016 12:13 PM EDT ----- All blood tests normal except LDL cholesterol higher. Need Krill Oil qd and Red Yeast Rice qd - OR- Pravastatin 20 mg qd #30 & 3 RF. Recheck cholesterol levels in 3 months.

## 2016-12-21 NOTE — Telephone Encounter (Signed)
Advised pt of lab results. Pt verbally acknowledges understanding. Pt prefers OTC red yeast rice and krill oil because statins caused side effects in the past. FU appointment scheduled. Renaldo Fiddler, CMA

## 2017-03-22 ENCOUNTER — Ambulatory Visit: Payer: Self-pay | Admitting: Family Medicine

## 2017-03-26 ENCOUNTER — Ambulatory Visit (INDEPENDENT_AMBULATORY_CARE_PROVIDER_SITE_OTHER): Payer: Medicare Other | Admitting: Family Medicine

## 2017-03-26 ENCOUNTER — Encounter: Payer: Self-pay | Admitting: Family Medicine

## 2017-03-26 VITALS — BP 118/68 | HR 72 | Temp 97.8°F | Resp 16 | Wt 185.0 lb

## 2017-03-26 DIAGNOSIS — E7800 Pure hypercholesterolemia, unspecified: Secondary | ICD-10-CM | POA: Diagnosis not present

## 2017-03-26 NOTE — Progress Notes (Signed)
Patient: Joseph Whitaker Male    DOB: [birth year]   71 y.o.   MRN: [id number] Visit Date: 03/26/2017  Today's Provider: Vernie Murders, PA   Chief Complaint  Patient presents with  . Hyperlipidemia   Subjective:    HPI      Lipid/Cholesterol, Follow-up:   Last seen for this 3 months ago.  Management changes since that visit include adding red yeast rice and krill. He experienced side effects from statins in the past. . Last Lipid Panel:    Component Value Date/Time   CHOL 193 12/20/2016 1020   TRIG 85 12/20/2016 1020   HDL 43 12/20/2016 1020   CHOLHDL 4.5 12/20/2016 1020   LDLCALC 133 (H) 12/20/2016 1020    Risk factors for vascular disease include hypercholesterolemia and hypertension  He reports fair compliance with treatment. He can not take the red yeast rice because it causes dizziness, nor is he taking krill oil. He is taking a supplement his daughter ordered online that is called Chol Support. Weight trend: increasing steadily Current diet: in general, a "healthy" diet   Current exercise: he is active. States, "I work more than most people I know".  Wt Readings from Last 3 Encounters:  03/26/17 185 lb (83.9 kg)  12/20/16 183 lb (83 kg)  09/18/16 176 lb (79.8 kg)  He is fasting for blood work today.  ----------------------------------------------------------------------------------------------------------------- Patient Active Problem List   Diagnosis Date Noted  . Enlarged prostate on rectal examination 12/20/2016  . S/P right TKA 09/18/2016  . S/P knee replacement 09/18/2016  . Lesion of penis 11/23/2015  . Pneumococcal vaccination declined 11/23/2015  . Cannot sleep 12/29/2009  . Excessive urination at night 07/02/2008  . Need for vaccination 07/02/2008  . CD (contact dermatitis) 01/19/2008  . Essential (primary) hypertension 06/16/2007  . HLD (hyperlipidemia) 08/10/2003   Past Surgical History:  Procedure Laterality Date  . NO PAST  SURGERIES    . TOTAL KNEE ARTHROPLASTY Right 09/18/2016   Procedure: Right TOTAL KNEE ARTHROPLASTY;  Surgeon: Paralee Cancel, MD;  Location: [facility];  Service: Orthopedics;  Laterality: Right;  Adductor Block   Family History  Problem Relation Age of Onset  . Dementia Father   . Hyperlipidemia Sister   . Heart attack Brother   . Heart disease Maternal Grandmother   . Heart disease Maternal Grandfather   . Colon cancer Paternal Grandfather   . Arthritis Brother   . Hyperlipidemia Brother   . Hyperlipidemia Brother    Allergies  Allergen Reactions  . Red Yeast Rice [Cholestin] Other (See Comments)    Dizziness  . Statins Other (See Comments)    Weight loss, malaise.    Current Outpatient Prescriptions:  .  amLODipine (NORVASC) 5 MG tablet, Take 1 tablet (5 mg total) by mouth daily., Disp: 90 tablet, Rfl: 3 .  levothyroxine (SYNTHROID, LEVOTHROID) 25 MCG tablet, Take 1 tablet (25 mcg total) by mouth daily., Disp: 30 tablet, Rfl: 3 .  Misc Natural Products (CHOLESTEROL SUPPORT PO), Take by mouth., Disp: , Rfl:   Review of Systems  Constitutional: Negative.   HENT: Negative.   Respiratory: Negative.   Cardiovascular: Negative.   Genitourinary: Negative.   Musculoskeletal: Negative.     Social History  Substance Use Topics  . Smoking status: Former Smoker    Quit date: 09/03/1985  . Smokeless tobacco: Never Used  . Alcohol use 0.6 oz/week    1 Cans of beer per week     Comment:  occasionally   Objective:   BP 118/68 (BP Location: Right Arm, Patient Position: Sitting, Cuff Size: Large)   Pulse 72   Temp 97.8 F (36.6 C) (Oral)   Resp 16   Wt 185 lb (83.9 kg)   BMI 27.72 kg/m  Vitals:   03/26/17 0811  BP: 118/68  Pulse: 72  Resp: 16  Temp: 97.8 F (36.6 C)  TempSrc: Oral  Weight: 185 lb (83.9 kg)   Physical Exam  Constitutional: He is oriented to person, place, and time. He appears well-developed and well-nourished. No distress.  HENT:  Head: Normocephalic and  atraumatic.  Right Ear: Hearing normal.  Left Ear: Hearing normal.  Nose: Nose normal.  Eyes: Conjunctivae and lids are normal. Right eye exhibits no discharge. Left eye exhibits no discharge. No scleral icterus.  Neck: Neck supple.  Cardiovascular: Normal rate and regular rhythm.   Pulmonary/Chest: Effort normal and breath sounds normal. No respiratory distress.  Abdominal: Bowel sounds are normal. He exhibits no mass.  Musculoskeletal: Normal range of motion.  Neurological: He is alert and oriented to person, place, and time.  Skin: Skin is intact. No lesion and no rash noted.  Psychiatric: He has a normal mood and affect. His speech is normal and behavior is normal. Thought content normal.      Assessment & Plan:     1. Pure hypercholesterolemia Stopped all statins and Red Yeast Rice because of malaise and myalgias. All side effects have cleared now that he is off these meds. Continues low fat diet. May use Krill Oil qd. Recheck lipids and CMP. Follow up pending reports. - Lipid panel - Comprehensive metabolic panel       Vernie Murders, PA  [facility]

## 2017-03-27 LAB — COMPREHENSIVE METABOLIC PANEL
ALBUMIN: 4.2 g/dL (ref 3.5–4.8)
ALT: 22 IU/L (ref 0–44)
AST: 27 IU/L (ref 0–40)
Albumin/Globulin Ratio: 1.7 (ref 1.2–2.2)
Alkaline Phosphatase: 62 IU/L (ref 39–117)
BUN / CREAT RATIO: 14 (ref 10–24)
BUN: 13 mg/dL (ref 8–27)
Bilirubin Total: 0.5 mg/dL (ref 0.0–1.2)
CALCIUM: 9.3 mg/dL (ref 8.6–10.2)
CO2: 24 mmol/L (ref 20–29)
CREATININE: 0.95 mg/dL (ref 0.76–1.27)
Chloride: 106 mmol/L (ref 96–106)
GFR calc non Af Amer: 81 mL/min/{1.73_m2} (ref >59)
GFR, EST AFRICAN AMERICAN: 93 mL/min/{1.73_m2} (ref >59)
GLUCOSE: 88 mg/dL (ref 65–99)
Globulin, Total: 2.5 g/dL (ref 1.5–4.5)
Potassium: 4.6 mmol/L (ref 3.5–5.2)
Sodium: 143 mmol/L (ref 134–144)
TOTAL PROTEIN: 6.7 g/dL (ref 6.0–8.5)

## 2017-03-27 LAB — LIPID PANEL
CHOL/HDL RATIO: 4.3 ratio (ref 0.0–5.0)
Cholesterol, Total: 173 mg/dL (ref 100–199)
HDL: 40 mg/dL (ref >39)
LDL Calculated: 120 mg/dL — ABNORMAL HIGH (ref 0–99)
Triglycerides: 63 mg/dL (ref 0–149)
VLDL Cholesterol Cal: 13 mg/dL (ref 5–40)

## 2017-03-28 ENCOUNTER — Telehealth: Payer: Self-pay

## 2017-03-28 NOTE — Telephone Encounter (Signed)
Patient advised.

## 2017-03-28 NOTE — Telephone Encounter (Signed)
lmtcb

## 2017-03-28 NOTE — Telephone Encounter (Signed)
-----   Message from Margo Common, [place] sent at 03/28/2017  8:29 AM EDT ----- LDL cholesterol improving. Continue low fat diet and present medications. Recheck levels in 6-8 months.

## 2017-04-10 ENCOUNTER — Other Ambulatory Visit: Payer: Self-pay

## 2017-04-10 NOTE — Telephone Encounter (Signed)
Patient called and states he needs refill on Levothyroxine. Patient has enough tablets to last till Friday this week. Please review-aa

## 2017-04-11 MED ORDER — LEVOTHYROXINE SODIUM 25 MCG PO TABS: 25.0000 ug | ORAL_TABLET | Freq: Every day | ORAL | 3 refills | Status: DC

## 2017-06-13 DIAGNOSIS — L570 Actinic keratosis: Secondary | ICD-10-CM | POA: Diagnosis not present

## 2017-06-13 DIAGNOSIS — D2272 Melanocytic nevi of left lower limb, including hip: Secondary | ICD-10-CM | POA: Diagnosis not present

## 2017-06-13 DIAGNOSIS — D225 Melanocytic nevi of trunk: Secondary | ICD-10-CM | POA: Diagnosis not present

## 2017-06-13 DIAGNOSIS — D2261 Melanocytic nevi of right upper limb, including shoulder: Secondary | ICD-10-CM | POA: Diagnosis not present

## 2017-06-13 DIAGNOSIS — Z85820 Personal history of malignant melanoma of skin: Secondary | ICD-10-CM | POA: Diagnosis not present

## 2017-06-13 DIAGNOSIS — X32XXXA Exposure to sunlight, initial encounter: Secondary | ICD-10-CM | POA: Diagnosis not present

## 2017-06-17 DIAGNOSIS — H1132 Conjunctival hemorrhage, left eye: Secondary | ICD-10-CM | POA: Diagnosis not present

## 2017-09-06 DIAGNOSIS — L309 Dermatitis, unspecified: Secondary | ICD-10-CM | POA: Diagnosis not present

## 2017-09-09 DIAGNOSIS — Z23 Encounter for immunization: Secondary | ICD-10-CM | POA: Diagnosis not present

## 2017-11-07 ENCOUNTER — Telehealth: Payer: Self-pay | Admitting: Family Medicine

## 2017-11-07 ENCOUNTER — Other Ambulatory Visit: Payer: Self-pay | Admitting: Family Medicine

## 2017-11-07 MED ORDER — AMLODIPINE BESYLATE 5 MG PO TABS: 5.0000 mg | ORAL_TABLET | Freq: Every day | ORAL | 3 refills | Status: DC

## 2017-11-07 NOTE — Telephone Encounter (Signed)
Thanks Got it.

## 2017-11-07 NOTE — Telephone Encounter (Signed)
Pt called to schedule CPE. Pt also scheduled AWV with [facility]. Both appts are scheduled for 12/16/17 @ 9 am & 9:40am. Thanks TNP

## 2017-11-07 NOTE — Telephone Encounter (Signed)
Pt contacted office for refill request on the following medications:  amLODipine (NORVASC) 5 MG tablet   90 day supply   [facility] [address]  Last Rx: 11/12/16 LOV: 03/26/17 NOV: 12/16/17 Please advise. Thanks TNP

## 2017-12-16 ENCOUNTER — Ambulatory Visit: Payer: Medicare Other

## 2017-12-16 ENCOUNTER — Encounter: Payer: Medicare Other | Admitting: Family Medicine

## 2018-01-03 ENCOUNTER — Ambulatory Visit (INDEPENDENT_AMBULATORY_CARE_PROVIDER_SITE_OTHER): Payer: Medicare Other

## 2018-01-03 ENCOUNTER — Ambulatory Visit (INDEPENDENT_AMBULATORY_CARE_PROVIDER_SITE_OTHER): Payer: Medicare Other | Admitting: Family Medicine

## 2018-01-03 ENCOUNTER — Encounter: Payer: Self-pay | Admitting: Family Medicine

## 2018-01-03 VITALS — BP 122/62 | HR 76 | Temp 98.3°F | Ht 69.0 in | Wt 177.6 lb

## 2018-01-03 DIAGNOSIS — Z0000 Encounter for general adult medical examination without abnormal findings: Secondary | ICD-10-CM

## 2018-01-03 DIAGNOSIS — Z23 Encounter for immunization: Secondary | ICD-10-CM | POA: Diagnosis not present

## 2018-01-03 DIAGNOSIS — E7800 Pure hypercholesterolemia, unspecified: Secondary | ICD-10-CM

## 2018-01-03 DIAGNOSIS — I10 Essential (primary) hypertension: Secondary | ICD-10-CM | POA: Diagnosis not present

## 2018-01-03 DIAGNOSIS — N400 Enlarged prostate without lower urinary tract symptoms: Secondary | ICD-10-CM

## 2018-01-03 LAB — POCT URINALYSIS DIPSTICK
BILIRUBIN UA: NEGATIVE
Blood, UA: NEGATIVE
Glucose, UA: NEGATIVE
KETONES UA: NEGATIVE
Leukocytes, UA: NEGATIVE
NITRITE UA: NEGATIVE
PH UA: 6.5 (ref 5.0–8.0)
PROTEIN UA: NEGATIVE
SPEC GRAV UA: 1.02 (ref 1.010–1.025)
UROBILINOGEN UA: 0.2 U/dL

## 2018-01-03 NOTE — Progress Notes (Signed)
Patient: Joseph Whitaker, Male    DOB: [birth year], 72 y.o.   MRN: [id number] Visit Date: 01/03/2018  Today's Provider: Vernie Murders, PA   Chief Complaint  Patient presents with  . Annual Exam   Subjective:     Complete Physical Joseph Whitaker is a 72 y.o. male. He feels well. He reports not exercising, but still works daily. He reports he is sleeping poorly.  -----------------------------------------------------------   Review of Systems  Constitutional: Negative.   HENT: Negative.   Eyes: Negative.   Respiratory: Negative.   Cardiovascular: Negative.   Gastrointestinal: Negative.   Endocrine: Negative.   Genitourinary: Negative.   Musculoskeletal: Negative.   Skin: Negative.   Allergic/Immunologic: Negative.   Neurological: Negative.   Hematological: Negative.   Psychiatric/Behavioral: Negative.     Social History   Socioeconomic History  . Marital status: Married    Spouse name: Not on file  . Number of children: 4  . Years of education: Not on file  . Highest education level: 12th grade  Occupational History  . Occupation: farmer  Social Needs  . Financial resource strain: Not hard at all  . Food insecurity:    Worry: Never true    Inability: Never true  . Transportation needs:    Medical: No    Non-medical: No  Tobacco Use  . Smoking status: Former Smoker    Last attempt to quit: 09/03/1985    Years since quitting: 32.3  . Smokeless tobacco: Never Used  Substance and Sexual Activity  . Alcohol use: Not Currently    Alcohol/week: 0.6 oz    Types: 1 Cans of beer per week  . Drug use: No  . Sexual activity: Not on file  Lifestyle  . Physical activity:    Days per week: Not on file    Minutes per session: Not on file  . Stress: Not at all  Relationships  . Social connections:    Talks on phone: Not on file    Gets together: Not on file    Attends religious service: Not on file    Active member of club or organization: Not on file    Attends meetings of clubs or organizations: Not on file    Relationship status: Not on file  . Intimate partner violence:    Fear of current or ex partner: Not on file    Emotionally abused: Not on file    Physically abused: Not on file    Forced sexual activity: Not on file  Other Topics Concern  . Not on file  Social History Narrative  . Not on file    Past Medical History:  Diagnosis Date  . Arthritis   . Hypertension   . Hypothyroidism      Patient Active Problem List   Diagnosis Date Noted  . Enlarged prostate on rectal examination 12/20/2016  . S/P right TKA 09/18/2016  . S/P knee replacement 09/18/2016  . Lesion of penis 11/23/2015  . Pneumococcal vaccination declined 11/23/2015  . Cannot sleep 12/29/2009  . Excessive urination at night 07/02/2008  . Need for vaccination 07/02/2008  . CD (contact dermatitis) 01/19/2008  . Essential (primary) hypertension 06/16/2007  . HLD (hyperlipidemia) 08/10/2003    Past Surgical History:  Procedure Laterality Date  . NO PAST SURGERIES    . TOTAL KNEE ARTHROPLASTY Right 09/18/2016   Procedure: Right TOTAL KNEE ARTHROPLASTY;  Surgeon: Paralee Cancel, MD;  Location: [facility];  Service: Orthopedics;  Laterality:  Right;  Adductor Block    His family history includes Arthritis in his brother; Colon cancer in his paternal grandfather; Dementia in his father; Heart attack in his brother; Heart disease in his maternal grandfather and maternal grandmother; Hyperlipidemia in his brother, brother, and sister.      Current Outpatient Medications:  .  amLODipine (NORVASC) 5 MG tablet, Take 1 tablet (5 mg total) by mouth daily., Disp: 90 tablet, Rfl: 3 .  Influenza vac split quadrivalent PF (FLUZONE HIGH-DOSE) 0.5 ML injection, Fluzone High-Dose 2016-2017 (PF) 180 mcg/0.5 mL intramuscular syringe  inject 0.5 milliliter intramuscularly, Disp: , Rfl:  .  levothyroxine (SYNTHROID, LEVOTHROID) 25 MCG tablet, Take 1 tablet (25 mcg total) by mouth  daily., Disp: 90 tablet, Rfl: 3 .  Misc Natural Products (CHOLESTEROL SUPPORT PO), Take 1 tablet by mouth 2 (two) times daily. , Disp: , Rfl:   Patient Care Team: Onur Mori, Vickki Muff, PA as PCP - General (Family Medicine)     Objective:   Vitals: BP 122/62 (BP Location: Right Arm, Patient Position: Sitting, Cuff Size: Normal)   Pulse 76   Temp 98.3 F (36.8 C) (Oral)   Ht 5\' 9"  (1.753 m)   Wt 177 lb 9.6 oz (80.6 kg)   BMI 26.23 kg/m  Wt Readings from Last 3 Encounters:  01/03/18 177 lb 9.6 oz (80.6 kg)  01/03/18 177 lb 9.6 oz (80.6 kg)  03/26/17 185 lb (83.9 kg)    Physical Exam  Activities of Daily Living In your present state of health, do you have any difficulty performing the following activities: 01/03/2018  Hearing? N  Vision? N  Difficulty concentrating or making decisions? N  Walking or climbing stairs? N  Dressing or bathing? N  Doing errands, shopping? N  Preparing Food and eating ? N  Using the Toilet? N  In the past six months, have you accidently leaked urine? N  Do you have problems with loss of bowel control? N  Managing your Medications? N  Managing your Finances? N  Housekeeping or managing your Housekeeping? N  Some recent data might be hidden    Fall Risk Assessment Fall Risk  01/03/2018 12/20/2016 12/19/2015 12/19/2015  Falls in the past year? No No No No     Depression Screen PHQ 2/9 Scores 01/03/2018 12/20/2016 12/19/2015 12/19/2015  PHQ - 2 Score 0 0 0 0  PHQ- 9 Score - 0 - -    Assessment & Plan:    Annual Physical Reviewed patient's Family Medical History Reviewed and updated list of patient's medical providers Assessment of cognitive impairment was done Assessed patient's functional ability Established a written schedule for health screening [place] Completed and Reviewed  Exercise Activities and Dietary recommendations Goals    . DIET - INCREASE WATER INTAKE     Recommend increasing water intake to 6-8 glasses a  day.       Immunization History  Administered Date(s) Administered  . Influenza Split 08/11/2009, 08/14/2010, 08/17/2011  . Influenza, High Dose Seasonal PF 08/06/2016, 09/09/2017  . Tdap 04/04/2009  . Zoster 07/02/2008    Health Maintenance  Topic Date Due  . PNA vac Low Risk Adult (1 of 2 - PCV13) 12/22/2011  . INFLUENZA VACCINE  04/03/2018  . TETANUS/TDAP  04/05/2019  . COLONOSCOPY  11/21/2020  . Hepatitis C Screening  Completed     Discussed health benefits of physical activity, and encouraged him to engage in regular exercise appropriate for his age and condition.    ------------------------------------------------------------------------------------------------------------  1. Annual physical exam General health stable. 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 Missing many teeth and plans on investigating dentures this Fall. Health wellness screening reviewed with patient. Will get routine labs and follow up pending reports. - CBC with Differential/Platelet - Comprehensive metabolic panel - Lipid panel - TSH - POCT urinalysis dipstick  2. Essential (primary) hypertension Well controlled BP and tolerating Amlodipine 5 mg qd without side effects. No chest pains, dyspnea, palpitations or edema. Recheck labs and follow up pending reports. - CBC with Differential/Platelet - Comprehensive metabolic panel - Lipid panel - TSH - POCT urinalysis dipstick  3. Enlarged prostate on rectal examination Prostate 40+ grams by DRE. Patient recognizes slow down of stream, hesitancy, nocturia 2-3 times a night and some dribbling. Will check urinalysis, PSA, CBC and renal function. Discussed probable need for Proscar and Rapaflo/Flomax if labs normal. May need referral to an urologist for further evaluation. - CBC with Differential/Platelet - PSA - POCT urinalysis dipstick  4. Pure hypercholesterolemia Trying to follow a low fat diet and  using a Natural Supplement to lower levels. Recheck labs and follow up pending reports. - Comprehensive metabolic panel - Lipid panel - TSH  5. Need for vaccination Due for Prevnar 13 but refuses today.    Vernie Murders, PA  [facility]

## 2018-01-03 NOTE — Progress Notes (Addendum)
Subjective:   Joseph Whitaker is a 72 y.o. male who presents for Medicare Annual/Subsequent preventive examination.  Review of Systems:  N/A  Cardiac Risk Factors include: advanced age (>59men, >84 women);dyslipidemia;hypertension;male gender     Objective:    Vitals: BP 122/62 (BP Location: Right Arm)   Pulse 76   Temp 98.3 F (36.8 C) (Oral)   Ht 5\' 9"  (1.753 m)   Wt 177 lb 9.6 oz (80.6 kg)   BMI 26.23 kg/m   Body mass index is 26.23 kg/m.  Advanced Directives 01/03/2018 09/18/2016 09/11/2016 12/19/2015  Does Patient Have a Medical Advance Directive? No No No No  Would patient like information on creating a medical advance directive? No - Patient declined No - Patient declined No - Patient declined No - patient declined information    Tobacco Social History   Tobacco Use  Smoking Status Former Smoker  . Last attempt to quit: 09/03/1985  . Years since quitting: 32.3  Smokeless Tobacco Never Used     Counseling given: Not Answered   Clinical Intake:  Pre-visit preparation completed: Yes  Pain : No/denies pain Pain Score: 0-No pain     Nutritional Status: BMI 25 -29 Overweight Nutritional Risks: None Diabetes: No  How often do you need to have someone help you when you read instructions, pamphlets, or other written materials from your doctor or pharmacy?: 1 - Never  Interpreter Needed?: No  Information entered by :: [facility], LPN  Past Medical History:  Diagnosis Date  . Arthritis   . Hypertension   . Hypothyroidism    Past Surgical History:  Procedure Laterality Date  . NO PAST SURGERIES    . TOTAL KNEE ARTHROPLASTY Right 09/18/2016   Procedure: Right TOTAL KNEE ARTHROPLASTY;  Surgeon: Paralee Cancel, MD;  Location: [facility];  Service: Orthopedics;  Laterality: Right;  Adductor Block   Family History  Problem Relation Age of Onset  . Dementia Father   . Hyperlipidemia Sister   . Heart attack Brother   . Heart disease Maternal Grandmother   . Heart  disease Maternal Grandfather   . Colon cancer Paternal Grandfather   . Arthritis Brother   . Hyperlipidemia Brother   . Hyperlipidemia Brother    Social History   Socioeconomic History  . Marital status: Married    Spouse name: Not on file  . Number of children: 4  . Years of education: Not on file  . Highest education level: 12th grade  Occupational History  . Occupation: farmer  Social Needs  . Financial resource strain: Not hard at all  . Food insecurity:    Worry: Never true    Inability: Never true  . Transportation needs:    Medical: No    Non-medical: No  Tobacco Use  . Smoking status: Former Smoker    Last attempt to quit: 09/03/1985    Years since quitting: 32.3  . Smokeless tobacco: Never Used  Substance and Sexual Activity  . Alcohol use: Not Currently    Alcohol/week: 0.6 oz    Types: 1 Cans of beer per week  . Drug use: No  . Sexual activity: Not on file  Lifestyle  . Physical activity:    Days per week: Not on file    Minutes per session: Not on file  . Stress: Not at all  Relationships  . Social connections:    Talks on phone: Not on file    Gets together: Not on file    Attends religious  service: Not on file    Active member of club or organization: Not on file    Attends meetings of clubs or organizations: Not on file    Relationship status: Not on file  Other Topics Concern  . Not on file  Social History Narrative  . Not on file    Outpatient Encounter Medications as of 01/03/2018  Medication Sig  . amLODipine (NORVASC) 5 MG tablet Take 1 tablet (5 mg total) by mouth daily.  . Influenza vac split quadrivalent PF (FLUZONE HIGH-DOSE) 0.5 ML injection Fluzone High-Dose 2016-2017 (PF) 180 mcg/0.5 mL intramuscular syringe  inject 0.5 milliliter intramuscularly  . levothyroxine (SYNTHROID, LEVOTHROID) 25 MCG tablet Take 1 tablet (25 mcg total) by mouth daily.  . Misc Natural Products (CHOLESTEROL SUPPORT PO) Take 1 tablet by mouth 2 (two) times  daily.    No facility-administered encounter medications on file as of 01/03/2018.     Activities of Daily Living In your present state of health, do you have any difficulty performing the following activities: 01/03/2018  Hearing? N  Vision? N  Difficulty concentrating or making decisions? N  Walking or climbing stairs? N  Dressing or bathing? N  Doing errands, shopping? N  Preparing Food and eating ? N  Using the Toilet? N  In the past six months, have you accidently leaked urine? N  Do you have problems with loss of bowel control? N  Managing your Medications? N  Managing your Finances? N  Housekeeping or managing your Housekeeping? N  Some recent data might be hidden    Patient Care Team: Chrismon, Vickki Muff, PA as PCP - General (Family Medicine)   Assessment:   This is a routine wellness examination for Joseph Whitaker.  Exercise Activities and Dietary recommendations Current Exercise Habits: The patient has a physically strenous job, but has no regular exercise apart from work., Exercise limited by: None identified  Goals    . DIET - INCREASE WATER INTAKE     Recommend increasing water intake to 6-8 glasses a day.       Fall Risk Fall Risk  01/03/2018 12/20/2016 12/19/2015 12/19/2015  Falls in the past year? No No No No   Is the patient's home free of loose throw rugs in walkways, pet beds, electrical cords, etc?   yes      Grab bars in the bathroom? yes      Handrails on the stairs?   yes      Adequate lighting?   yes  Timed Get Up and Go Performed: N/A  Depression Screen PHQ 2/9 Scores 01/03/2018 12/20/2016 12/19/2015 12/19/2015  PHQ - 2 Score 0 0 0 0  PHQ- 9 Score - 0 - -    Cognitive Function: Pt declined screening today.         Immunization History  Administered Date(s) Administered  . Influenza Split 08/11/2009, 08/14/2010, 08/17/2011  . Influenza, High Dose Seasonal PF 08/06/2016, 09/09/2017  . Tdap 04/04/2009  . Zoster 07/02/2008    Qualifies for Shingles  Vaccine? Due for Shingles vaccine. Declined my offer to administer today. Education has been provided regarding the importance of this vaccine. Pt has been advised to call her insurance company to determine her out of pocket expense. Advised she may also receive this vaccine at her local pharmacy or Health Dept. Verbalized acceptance and understanding.  Screening Tests Health Maintenance  Topic Date Due  . PNA vac Low Risk Adult (1 of 2 - PCV13) 12/22/2011  . INFLUENZA VACCINE  04/03/2018  .  TETANUS/TDAP  04/05/2019  . COLONOSCOPY  11/21/2020  . Hepatitis C Screening  Completed   Cancer Screenings: Lung: Low Dose CT Chest recommended if Age 20-80 years, 30 pack-year currently smoking OR have quit w/in 15years. Patient does not qualify. Colorectal: Up to date  Additional Screenings:  Hepatitis C Screening: Up to date      Plan:  I have personally reviewed and addressed the Medicare Annual Wellness questionnaire and have noted the following in the patient's chart:  A. Medical and social history B. Use of alcohol, tobacco or illicit drugs  C. Current medications and supplements D. Functional ability and status E.  Nutritional status F.  Physical activity G. Advance directives H. List of other physicians I.  Hospitalizations, surgeries, and ER visits in previous 12 months J.  [place] such as hearing and vision if needed, cognitive and depression L. Referrals and appointments - none  In addition, I have reviewed and discussed with patient certain preventive protocols, quality metrics, and best practice recommendations. A written personalized care plan for preventive services as well as general preventive health recommendations were provided to patient.  See attached scanned questionnaire for additional information.   Signed,  Fabio Neighbors, LPN Nurse Health Advisor   Nurse Recommendations: Pt declined the Prevnar 13 vaccine today.   Reviewed documentation and  recommendations of the Nurse Health Advisor. Was available for consultation. Agree with note and recommendations.

## 2018-01-03 NOTE — Patient Instructions (Signed)
Joseph Whitaker , Thank you for taking time to come for your Medicare Wellness Visit. I appreciate your ongoing commitment to your health goals. Please review the following plan we discussed and let me know if I can assist you in the future.   Screening recommendations/referrals: Colonoscopy: Up to date Recommended yearly ophthalmology/optometry visit for glaucoma screening and checkup Recommended yearly dental visit for hygiene and checkup  Vaccinations: Influenza vaccine: N/A Pneumococcal vaccine: Pt declines today.  Tdap vaccine: Up to date Shingles vaccine: Pt declines today.     Advanced directives: Advance directive discussed with you today. Even though you declined this today please call our office should you change your mind and we can give you the proper paperwork for you to fill out.  Conditions/risks identified: Recommend increasing water intake to 6-8 glasses a day.  Next appointment: 9:20 AM today with Vernie Murders.  Preventive Care 3 Years and Older, Male Preventive care refers to lifestyle choices and visits with your health care provider that can promote health and wellness. What does preventive care include?  A yearly physical exam. This is also called an annual well check.  Dental exams once or twice a year.  Routine eye exams. Ask your health care provider how often you should have your eyes checked.  Personal lifestyle choices, including:  Daily care of your teeth and gums.  Regular physical activity.  Eating a healthy diet.  Avoiding tobacco and drug use.  Limiting alcohol use.  Practicing safe sex.  Taking low doses of aspirin every day.  Taking vitamin and mineral supplements as recommended by your health care provider. What happens during an annual well check? The services and screenings done by your health care provider during your annual well check will depend on your age, overall health, lifestyle risk factors, and family history of  disease. Counseling  Your health care provider may ask you questions about your:  Alcohol use.  Tobacco use.  Drug use.  Emotional well-being.  Home and relationship well-being.  Sexual activity.  Eating habits.  History of falls.  Memory and ability to understand (cognition).  Work and work Statistician. Screening  You may have the following tests or measurements:  Height, weight, and BMI.  Blood pressure.  Lipid and cholesterol levels. These may be checked every 5 years, or more frequently if you are over 3 years old.  Skin check.  Lung cancer screening. You may have this screening every year starting at age 35 if you have a 30-pack-year history of smoking and currently smoke or have quit within the past 15 years.  Fecal occult blood test (FOBT) of the stool. You may have this test every year starting at age 71.  Flexible sigmoidoscopy or colonoscopy. You may have a sigmoidoscopy every 5 years or a colonoscopy every 10 years starting at age 48.  Prostate cancer screening. Recommendations will vary depending on your family history and other risks.  Hepatitis C blood test.  Hepatitis B blood test.  Sexually transmitted disease (STD) testing.  Diabetes screening. This is done by checking your blood sugar (glucose) after you have not eaten for a while (fasting). You may have this done every 1-3 years.  Abdominal aortic aneurysm (AAA) screening. You may need this if you are a current or former smoker.  Osteoporosis. You may be screened starting at age 74 if you are at high risk. Talk with your health care provider about your test results, treatment options, and if necessary, the need for more tests.  Vaccines  Your health care provider may recommend certain vaccines, such as:  Influenza vaccine. This is recommended every year.  Tetanus, diphtheria, and acellular pertussis (Tdap, Td) vaccine. You may need a Td booster every 10 years.  Zoster vaccine. You may  need this after age 55.  Pneumococcal 13-valent conjugate (PCV13) vaccine. One dose is recommended after age 64.  Pneumococcal polysaccharide (PPSV23) vaccine. One dose is recommended after age 43. Talk to your health care provider about which screenings and vaccines you need and how often you need them. This information is not intended to replace advice given to you by your health care provider. Make sure you discuss any questions you have with your health care provider. Document Released: 09/16/2015 Document Revised: 05/09/2016 Document Reviewed: 06/21/2015 Elsevier Interactive Patient Education  2017 [place] Prevention in the Home Falls can cause injuries. They can happen to people of all ages. There are many things you can do to make your home safe and to help prevent falls. What can I do on the outside of my home?  Regularly fix the edges of walkways and driveways and fix any cracks.  Remove anything that might make you trip as you walk through a door, such as a raised step or threshold.  Trim any bushes or trees on the path to your home.  Use bright outdoor lighting.  Clear any walking paths of anything that might make someone trip, such as rocks or tools.  Regularly check to see if handrails are loose or broken. Make sure that both sides of any steps have handrails.  Any raised decks and porches should have guardrails on the edges.  Have any leaves, snow, or ice cleared regularly.  Use sand or salt on walking paths during winter.  Clean up any spills in your garage right away. This includes oil or grease spills. What can I do in the bathroom?  Use night lights.  Install grab bars by the toilet and in the tub and shower. Do not use towel bars as grab bars.  Use non-skid mats or decals in the tub or shower.  If you need to sit down in the shower, use a plastic, non-slip stool.  Keep the floor dry. Clean up any water that spills on the floor as soon as it  happens.  Remove soap buildup in the tub or shower regularly.  Attach bath mats securely with double-sided non-slip rug tape.  Do not have throw rugs and other things on the floor that can make you trip. What can I do in the bedroom?  Use night lights.  Make sure that you have a light by your bed that is easy to reach.  Do not use any sheets or blankets that are too big for your bed. They should not hang down onto the floor.  Have a firm chair that has side arms. You can use this for support while you get dressed.  Do not have throw rugs and other things on the floor that can make you trip. What can I do in the kitchen?  Clean up any spills right away.  Avoid walking on wet floors.  Keep items that you use a lot in easy-to-reach places.  If you need to reach something above you, use a strong step stool that has a grab bar.  Keep electrical cords out of the way.  Do not use floor polish or wax that makes floors slippery. If you must use wax, use non-skid floor wax.  Do not have throw rugs and other things on the floor that can make you trip. What can I do with my stairs?  Do not leave any items on the stairs.  Make sure that there are handrails on both sides of the stairs and use them. Fix handrails that are broken or loose. Make sure that handrails are as long as the stairways.  Check any carpeting to make sure that it is firmly attached to the stairs. Fix any carpet that is loose or worn.  Avoid having throw rugs at the top or bottom of the stairs. If you do have throw rugs, attach them to the floor with carpet tape.  Make sure that you have a light switch at the top of the stairs and the bottom of the stairs. If you do not have them, ask someone to add them for you. What else can I do to help prevent falls?  Wear shoes that:  Do not have high heels.  Have rubber bottoms.  Are comfortable and fit you well.  Are closed at the toe. Do not wear sandals.  If you  use a stepladder:  Make sure that it is fully opened. Do not climb a closed stepladder.  Make sure that both sides of the stepladder are locked into place.  Ask someone to hold it for you, if possible.  Clearly mark and make sure that you can see:  Any grab bars or handrails.  First and last steps.  Where the edge of each step is.  Use tools that help you move around (mobility aids) if they are needed. These include:  Canes.  Walkers.  Scooters.  Crutches.  Turn on the lights when you go into a dark area. Replace any light bulbs as soon as they burn out.  Set up your furniture so you have a clear path. Avoid moving your furniture around.  If any of your floors are uneven, fix them.  If there are any pets around you, be aware of where they are.  Review your medicines with your doctor. Some medicines can make you feel dizzy. This can increase your chance of falling. Ask your doctor what other things that you can do to help prevent falls. This information is not intended to replace advice given to you by your health care provider. Make sure you discuss any questions you have with your health care provider. Document Released: 06/16/2009 Document Revised: 01/26/2016 Document Reviewed: 09/24/2014 Elsevier Interactive Patient Education  2017 [employer].

## 2018-01-04 LAB — COMPREHENSIVE METABOLIC PANEL
ALK PHOS: 58 IU/L (ref 39–117)
ALT: 22 IU/L (ref 0–44)
AST: 31 IU/L (ref 0–40)
Albumin/Globulin Ratio: 1.8 (ref 1.2–2.2)
Albumin: 4.3 g/dL (ref 3.5–4.8)
BILIRUBIN TOTAL: 0.6 mg/dL (ref 0.0–1.2)
BUN / CREAT RATIO: 22 (ref 10–24)
BUN: 19 mg/dL (ref 8–27)
CO2: 22 mmol/L (ref 20–29)
Calcium: 9.4 mg/dL (ref 8.6–10.2)
Chloride: 108 mmol/L — ABNORMAL HIGH (ref 96–106)
Creatinine, Ser: 0.87 mg/dL (ref 0.76–1.27)
GFR, EST AFRICAN AMERICAN: 100 mL/min/{1.73_m2} (ref >59)
GFR, EST NON AFRICAN AMERICAN: 87 mL/min/{1.73_m2} (ref >59)
GLOBULIN, TOTAL: 2.4 g/dL (ref 1.5–4.5)
Glucose: 87 mg/dL (ref 65–99)
POTASSIUM: 4.2 mmol/L (ref 3.5–5.2)
SODIUM: 145 mmol/L — AB (ref 134–144)
TOTAL PROTEIN: 6.7 g/dL (ref 6.0–8.5)

## 2018-01-04 LAB — LIPID PANEL
CHOL/HDL RATIO: 4.2 ratio (ref 0.0–5.0)
Cholesterol, Total: 159 mg/dL (ref 100–199)
HDL: 38 mg/dL — ABNORMAL LOW (ref >39)
LDL CALC: 107 mg/dL — AB (ref 0–99)
TRIGLYCERIDES: 71 mg/dL (ref 0–149)
VLDL Cholesterol Cal: 14 mg/dL (ref 5–40)

## 2018-01-04 LAB — CBC WITH DIFFERENTIAL/PLATELET
BASOS: 1 %
Basophils Absolute: 0 10*3/uL (ref 0.0–0.2)
EOS (ABSOLUTE): 0.1 10*3/uL (ref 0.0–0.4)
EOS: 2 %
HEMATOCRIT: 39.3 % (ref 37.5–51.0)
Hemoglobin: 13.3 g/dL (ref 13.0–17.7)
Immature Grans (Abs): 0 10*3/uL (ref 0.0–0.1)
Immature Granulocytes: 0 %
LYMPHS ABS: 1 10*3/uL (ref 0.7–3.1)
Lymphs: 24 %
MCH: 31.5 pg (ref 26.6–33.0)
MCHC: 33.8 g/dL (ref 31.5–35.7)
MCV: 93 fL (ref 79–97)
Monocytes Absolute: 0.4 10*3/uL (ref 0.1–0.9)
Monocytes: 9 %
NEUTROS ABS: 2.6 10*3/uL (ref 1.4–7.0)
NEUTROS PCT: 64 %
Platelets: 283 10*3/uL (ref 150–379)
RBC: 4.22 x10E6/uL (ref 4.14–5.80)
RDW: 13.7 % (ref 12.3–15.4)
WBC: 4.1 10*3/uL (ref 3.4–10.8)

## 2018-01-04 LAB — PSA: PROSTATE SPECIFIC AG, SERUM: 1.5 ng/mL (ref 0.0–4.0)

## 2018-01-04 LAB — TSH: TSH: 3.59 u[IU]/mL (ref 0.450–4.500)

## 2018-01-07 ENCOUNTER — Telehealth: Payer: Self-pay

## 2018-01-07 NOTE — Telephone Encounter (Signed)
Patient advised. He verbalized understanding. He will let Simona Huh know if he has any significant problems with urine flow or nocturia.

## 2018-01-07 NOTE — Telephone Encounter (Signed)
-----   Message from Margo Common, [place] sent at 01/06/2018  5:04 PM EDT ----- Normal PSA, no sign of infection, anemia or kidney stress. Cholesterol better except slight drop in HDL. Recommend continuing present supplement. If any significant problems with urine flow or getting up at night to urinate, should consider Flomax and Proscar.

## 2018-01-23 ENCOUNTER — Telehealth: Payer: Self-pay | Admitting: Family Medicine

## 2018-01-23 MED ORDER — TAMSULOSIN HCL 0.4 MG PO CAPS: 0.4000 mg | ORAL_CAPSULE | Freq: Every day | ORAL | 3 refills | Status: DC

## 2018-01-23 MED ORDER — FINASTERIDE 5 MG PO TABS: 5.0000 mg | ORAL_TABLET | Freq: Every day | ORAL | 3 refills | Status: DC

## 2018-01-23 NOTE — Telephone Encounter (Signed)
Pt called back saying he does want to take the medication Simona Huh suggested for his enlarged prostate.  He uses [facility] [address] [facility]  Pt's call back [phone]  thanks teri

## 2018-01-23 NOTE — Telephone Encounter (Signed)
Patient advised. RX sent to [facility]. Pharmacy. Follow up scheduled.

## 2018-01-23 NOTE — Telephone Encounter (Signed)
Finasteride 5 mg qd #90 & 3 RF with Tamsulosin 0.4 mg qd #90. Recheck appointment in 1 month to assess progress.

## 2018-02-27 ENCOUNTER — Ambulatory Visit: Payer: Medicare Other | Admitting: Family Medicine

## 2018-04-08 ENCOUNTER — Telehealth: Payer: Self-pay | Admitting: Family Medicine

## 2018-04-08 MED ORDER — LEVOTHYROXINE SODIUM 25 MCG PO TABS: 25.0000 ug | ORAL_TABLET | Freq: Every day | ORAL | 1 refills | Status: DC

## 2018-04-08 NOTE — Telephone Encounter (Signed)
Pt needs a refill on his   Levothyroxine 25 MCG  [facility] [address] [facility]

## 2018-04-08 NOTE — Telephone Encounter (Signed)
[facility] to refill

## 2018-04-15 DIAGNOSIS — T1502XA Foreign body in cornea, left eye, initial encounter: Secondary | ICD-10-CM | POA: Diagnosis not present

## 2018-05-16 DIAGNOSIS — I10 Essential (primary) hypertension: Secondary | ICD-10-CM | POA: Diagnosis not present

## 2018-05-16 DIAGNOSIS — H25813 Combined forms of age-related cataract, bilateral: Secondary | ICD-10-CM | POA: Diagnosis not present

## 2018-05-16 DIAGNOSIS — D3140 Benign neoplasm of unspecified ciliary body: Secondary | ICD-10-CM | POA: Diagnosis not present

## 2018-05-16 DIAGNOSIS — H35033 Hypertensive retinopathy, bilateral: Secondary | ICD-10-CM | POA: Diagnosis not present

## 2018-05-20 ENCOUNTER — Encounter: Payer: Self-pay | Admitting: Family Medicine

## 2018-05-20 ENCOUNTER — Ambulatory Visit (INDEPENDENT_AMBULATORY_CARE_PROVIDER_SITE_OTHER): Payer: Medicare Other | Admitting: Family Medicine

## 2018-05-20 VITALS — BP 137/76 | HR 67 | Temp 97.8°F | Resp 16 | Wt 181.0 lb

## 2018-05-20 DIAGNOSIS — R42 Dizziness and giddiness: Secondary | ICD-10-CM

## 2018-05-20 MED ORDER — MECLIZINE HCL 12.5 MG PO TABS: 12.5000 mg | ORAL_TABLET | Freq: Three times a day (TID) | ORAL | 0 refills | Status: DC

## 2018-05-20 NOTE — Progress Notes (Signed)
Patient: Joseph Whitaker Male    DOB: 1946/06/18   72 y.o.   MRN: [id number] Visit Date: 05/20/2018  Today's Provider: Lelon Huh, MD   Chief Complaint  Patient presents with  . Dizziness    x 2 days   Subjective:    Dizziness  This is a recurrent problem. Episode onset: 2 days ago. The problem occurs intermittently. The problem has been gradually improving. Associated symptoms include congestion (sinus congestion), fatigue, headaches and numbness (in fingers and right hand). Pertinent negatives include no abdominal pain, chest pain, chills, diaphoresis, fever, nausea, visual change, vomiting or weakness.  Patient states it feels like the room is moving when he lays back or when sitting up. Has history of vertigo in the past, the last was about 4 months ago and resolved on its own after a few days. Had similar, but much more severe episode in 2008 and had normal head CT at that time.      Allergies  Allergen Reactions  . Red Yeast Rice [Cholestin] Other (See Comments)    Dizziness  . Statins Other (See Comments)    Weight loss, malaise.     Current Outpatient Medications:  .  amLODipine (NORVASC) 5 MG tablet, Take 1 tablet (5 mg total) by mouth daily., Disp: 90 tablet, Rfl: 3 .  levothyroxine (SYNTHROID, LEVOTHROID) 25 MCG tablet, Take 1 tablet (25 mcg total) by mouth daily., Disp: 90 tablet, Rfl: 1 .  Misc Natural Products (CHOLESTEROL SUPPORT PO), Take 1 tablet by mouth 2 (two) times daily. , Disp: , Rfl:  .  finasteride (PROSCAR) 5 MG tablet, Take 1 tablet (5 mg total) by mouth daily. (Patient not taking: Reported on 05/20/2018), Disp: 90 tablet, Rfl: 3 .  tamsulosin (FLOMAX) 0.4 MG CAPS capsule, Take 1 capsule (0.4 mg total) by mouth daily. (Patient not taking: Reported on 05/20/2018), Disp: 90 capsule, Rfl: 3  Review of Systems  Constitutional: Positive for fatigue. Negative for appetite change, chills, diaphoresis and fever.  HENT: Positive for congestion (sinus  congestion).   Eyes: Negative for visual disturbance.  Respiratory: Negative for chest tightness, shortness of breath and wheezing.   Cardiovascular: Negative for chest pain and palpitations.  Gastrointestinal: Negative for abdominal pain, nausea and vomiting.  Neurological: Positive for dizziness, numbness (in fingers and right hand) and headaches. Negative for weakness.    Social History   Tobacco Use  . Smoking status: Former Smoker    Last attempt to quit: 09/03/1985    Years since quitting: 32.7  . Smokeless tobacco: Never Used  Substance Use Topics  . Alcohol use: Not Currently    Alcohol/week: 1.0 standard drinks    Types: 1 Cans of beer per week   Objective:   BP 137/76 (BP Location: Left Arm, Patient Position: Sitting, Cuff Size: Normal)   Pulse 67   Temp 97.8 F (36.6 C) (Oral)   Resp 16   Wt 181 lb (82.1 kg)   SpO2 97% Comment: room air  BMI 26.73 kg/m    Physical Exam   General Appearance:    Alert, cooperative, no distress  Eyes:    PERRL, conjunctiva/corneas clear, EOM's intact. Mild horizontal nystagmus. Normal finger to nose. Negative rhomberg.       Lungs:     Clear to auscultation bilaterally, respirations unlabored  Heart:    Regular rate and rhythm  Neurologic:   Awake, alert, oriented x 3. No apparent focal neurological  defect.          Assessment & Plan:     1. Vertigo  - meclizine (ANTIVERT) 12.5 MG tablet; Take 1-2 tablets (12.5-25 mg total) by mouth 3 (three) times daily.  Dispense: 30 tablet; Refill: 0  Call if symptoms change or if not rapidly improving.          Lelon Huh, MD  [facility]

## 2018-05-20 NOTE — Patient Instructions (Signed)

## 2018-06-12 DIAGNOSIS — X32XXXA Exposure to sunlight, initial encounter: Secondary | ICD-10-CM | POA: Diagnosis not present

## 2018-06-12 DIAGNOSIS — Z08 Encounter for follow-up examination after completed treatment for malignant neoplasm: Secondary | ICD-10-CM | POA: Diagnosis not present

## 2018-06-12 DIAGNOSIS — Z85820 Personal history of malignant melanoma of skin: Secondary | ICD-10-CM | POA: Diagnosis not present

## 2018-06-12 DIAGNOSIS — D2271 Melanocytic nevi of right lower limb, including hip: Secondary | ICD-10-CM | POA: Diagnosis not present

## 2018-06-12 DIAGNOSIS — D2261 Melanocytic nevi of right upper limb, including shoulder: Secondary | ICD-10-CM | POA: Diagnosis not present

## 2018-06-12 DIAGNOSIS — D2262 Melanocytic nevi of left upper limb, including shoulder: Secondary | ICD-10-CM | POA: Diagnosis not present

## 2018-06-12 DIAGNOSIS — L570 Actinic keratosis: Secondary | ICD-10-CM | POA: Diagnosis not present

## 2018-10-06 ENCOUNTER — Other Ambulatory Visit: Payer: Self-pay | Admitting: Family Medicine

## 2018-10-06 MED ORDER — LEVOTHYROXINE SODIUM 25 MCG PO TABS: 25.0000 ug | ORAL_TABLET | Freq: Every day | ORAL | 1 refills | Status: DC

## 2018-10-06 NOTE — Telephone Encounter (Signed)
Patient needs refill on Levothyroxine 25 mcg sent to [facility] on [address].

## 2018-10-13 DIAGNOSIS — L814 Other melanin hyperpigmentation: Secondary | ICD-10-CM | POA: Diagnosis not present

## 2018-10-13 DIAGNOSIS — L300 Nummular dermatitis: Secondary | ICD-10-CM | POA: Diagnosis not present

## 2018-10-13 DIAGNOSIS — D485 Neoplasm of uncertain behavior of skin: Secondary | ICD-10-CM | POA: Diagnosis not present

## 2018-10-27 ENCOUNTER — Ambulatory Visit (INDEPENDENT_AMBULATORY_CARE_PROVIDER_SITE_OTHER): Payer: Medicare Other | Admitting: Family Medicine

## 2018-10-27 ENCOUNTER — Encounter: Payer: Self-pay | Admitting: Family Medicine

## 2018-10-27 VITALS — BP 110/60 | HR 83 | Temp 98.3°F | Resp 16 | Wt 181.0 lb

## 2018-10-27 DIAGNOSIS — R42 Dizziness and giddiness: Secondary | ICD-10-CM

## 2018-10-27 DIAGNOSIS — I10 Essential (primary) hypertension: Secondary | ICD-10-CM | POA: Diagnosis not present

## 2018-10-27 MED ORDER — MECLIZINE HCL 12.5 MG PO TABS: 12.5000 mg | ORAL_TABLET | Freq: Three times a day (TID) | ORAL | 0 refills | Status: DC

## 2018-10-27 NOTE — Progress Notes (Signed)
Patient: MCKYLE SOLANKI Male    DOB: 1946-01-02   73 y.o.   MRN: [id number] Visit Date: 10/27/2018  Today's Provider: Vernie Murders, PA   Chief Complaint  Patient presents with  . Dizziness   Subjective:     Dizziness  This is a new problem. The current episode started 1 to 4 weeks ago (2 day). The problem occurs intermittently. Associated symptoms include chills, headaches and vertigo. Pertinent negatives include no abdominal pain, anorexia, arthralgias, change in bowel habit, chest pain, congestion, coughing, diaphoresis, fatigue, fever, joint swelling, myalgias, nausea, neck pain, numbness, rash, sore throat, swollen glands, urinary symptoms, visual change, vomiting or weakness. The symptoms are aggravated by bending and twisting (laying down).   Patient has had intermittent dizziness for about 2 weeks. Patient states he also has symptoms of chills, headaches and a vertigo sensation. Patient states dizziness is worse when he bends his head a certain way, bending, twisting, or when he is laying down.   Past Medical History:  Diagnosis Date  . Arthritis   . Hypertension   . Hypothyroidism    Past Surgical History:  Procedure Laterality Date  . NO PAST SURGERIES    . TOTAL KNEE ARTHROPLASTY Right 09/18/2016   Procedure: Right TOTAL KNEE ARTHROPLASTY;  Surgeon: Paralee Cancel, MD;  Location: [facility];  Service: Orthopedics;  Laterality: Right;  Adductor Block   Family History  Problem Relation Age of Onset  . Dementia Father   . Hyperlipidemia Sister   . Heart attack Brother   . Heart disease Maternal Grandmother   . Heart disease Maternal Grandfather   . Colon cancer Paternal Grandfather   . Arthritis Brother   . Hyperlipidemia Brother   . Hyperlipidemia Brother    Allergies  Allergen Reactions  . Red Yeast Rice [Cholestin] Other (See Comments)    Dizziness  . Statins Other (See Comments)    Weight loss, malaise.    Current Outpatient Medications:  .   amLODipine (NORVASC) 5 MG tablet, Take 1 tablet (5 mg total) by mouth daily., Disp: 90 tablet, Rfl: 3 .  finasteride (PROSCAR) 5 MG tablet, Take 1 tablet (5 mg total) by mouth daily., Disp: 90 tablet, Rfl: 3 .  levothyroxine (SYNTHROID, LEVOTHROID) 25 MCG tablet, Take 1 tablet (25 mcg total) by mouth daily., Disp: 90 tablet, Rfl: 1 .  meclizine (ANTIVERT) 12.5 MG tablet, Take 1-2 tablets (12.5-25 mg total) by mouth 3 (three) times daily., Disp: 30 tablet, Rfl: 0 .  Misc Natural Products (CHOLESTEROL SUPPORT PO), Take 1 tablet by mouth 2 (two) times daily. , Disp: , Rfl:   Review of Systems  Constitutional: Positive for chills. Negative for appetite change, diaphoresis, fatigue and fever.  HENT: Negative for congestion and sore throat.   Respiratory: Negative for cough, chest tightness, shortness of breath and wheezing.   Cardiovascular: Negative for chest pain and palpitations.  Gastrointestinal: Negative for abdominal pain, anorexia, change in bowel habit, nausea and vomiting.  Musculoskeletal: Negative for arthralgias, joint swelling, myalgias and neck pain.  Skin: Negative for rash.  Neurological: Positive for dizziness, vertigo and headaches. Negative for weakness and numbness.   Social History   Tobacco Use  . Smoking status: Former Smoker    Last attempt to quit: 09/03/1985    Years since quitting: 33.1  . Smokeless tobacco: Never Used  Substance Use Topics  . Alcohol use: Not Currently    Alcohol/week: 1.0 standard drinks    Types: 1 Cans of  beer per week     Objective:   BP 110/60 (BP Location: Right Arm, Patient Position: Sitting, Cuff Size: Large)   Pulse 83   Temp 98.3 F (36.8 C) (Oral)   Resp 16   Wt 181 lb (82.1 kg)   SpO2 97%   BMI 26.73 kg/m  Vitals:   10/27/18 1410  BP: 110/60  Pulse: 83  Resp: 16  Temp: 98.3 F (36.8 C)  TempSrc: Oral  SpO2: 97%  Weight: 181 lb (82.1 kg)   Physical Exam Constitutional:      General: He is not in acute distress.     Appearance: He is well-developed.  HENT:     Head: Normocephalic and atraumatic.     Right Ear: Hearing and tympanic membrane normal.     Left Ear: Hearing and tympanic membrane normal.     Nose: Nose normal.     Mouth/Throat:     Pharynx: Oropharynx is clear.  Eyes:     General: Lids are normal. No scleral icterus.       Right eye: No discharge.        Left eye: No discharge.     Conjunctiva/sclera: Conjunctivae normal.     Comments: No nystagmus  Neck:     Musculoskeletal: Neck supple.     Vascular: No carotid bruit.  Cardiovascular:     Rate and Rhythm: Normal rate and regular rhythm.     Pulses: Normal pulses.     Heart sounds: Normal heart sounds.  Pulmonary:     Effort: Pulmonary effort is normal. No respiratory distress.     Breath sounds: Normal breath sounds.  Abdominal:     General: Bowel sounds are normal.     Palpations: Abdomen is soft.  Musculoskeletal: Normal range of motion.  Lymphadenopathy:     Cervical: No cervical adenopathy.  Skin:    Findings: No lesion or rash.  Neurological:     Mental Status: He is alert and oriented to person, place, and time.     Coordination: Coordination normal.     Gait: Gait normal.     Deep Tendon Reflexes: Reflexes normal.  Psychiatric:        Speech: Speech normal.        Behavior: Behavior normal.        Thought Content: Thought content normal.       Assessment & Plan    1. Vertigo Developed dizziness over the past couple weeks. No LOC, nausea, vomiting, loss of appetite, fainting, carotid bruits, nystagmus, palpitations, dyspnea or headaches. Occurs when he lies down or hyperextends his neck. No room spinning sensation after he sits or lies still for a few seconds. Had similar episodes in September 2019 and more severe in 2008 (went to ER and CT scan was normal). Will refill Meclizine (helps calm down dizziness) and get routine labs. May need to consider repeat CT scan of head. Has intermittent episodes of congested  sensation in the left sinuses.  - meclizine (ANTIVERT) 12.5 MG tablet; Take 1-2 tablets (12.5-25 mg total) by mouth 3 (three) times daily.  Dispense: 30 tablet; Refill: 0 - CBC with Differential/Platelet - Comprehensive metabolic panel - TSH  2. Essential (primary) hypertension Tolerating the Amlodipine 5 mg qd without peripheral edema. No chest pains, palpitations or dyspnea. Recheck CBC, CMP and TSH.  - CBC with Differential/Platelet - Comprehensive metabolic panel - TSH     Vernie Murders, PA  [facility]

## 2018-10-28 ENCOUNTER — Telehealth: Payer: Self-pay | Admitting: *Deleted

## 2018-10-28 LAB — CBC WITH DIFFERENTIAL/PLATELET
BASOS ABS: 0 10*3/uL (ref 0.0–0.2)
BASOS: 1 %
EOS (ABSOLUTE): 0 10*3/uL (ref 0.0–0.4)
Eos: 1 %
HEMOGLOBIN: 12.9 g/dL — AB (ref 13.0–17.7)
Hematocrit: 37.5 % (ref 37.5–51.0)
IMMATURE GRANS (ABS): 0 10*3/uL (ref 0.0–0.1)
IMMATURE GRANULOCYTES: 0 %
LYMPHS: 24 %
Lymphocytes Absolute: 1.3 10*3/uL (ref 0.7–3.1)
MCH: 31 pg (ref 26.6–33.0)
MCHC: 34.4 g/dL (ref 31.5–35.7)
MCV: 90 fL (ref 79–97)
MONOCYTES: 9 %
Monocytes Absolute: 0.5 10*3/uL (ref 0.1–0.9)
NEUTROS PCT: 65 %
Neutrophils Absolute: 3.4 10*3/uL (ref 1.4–7.0)
PLATELETS: 299 10*3/uL (ref 150–450)
RBC: 4.16 x10E6/uL (ref 4.14–5.80)
RDW: 12.4 % (ref 11.6–15.4)
WBC: 5.2 10*3/uL (ref 3.4–10.8)

## 2018-10-28 LAB — COMPREHENSIVE METABOLIC PANEL
ALK PHOS: 62 IU/L (ref 39–117)
ALT: 25 IU/L (ref 0–44)
AST: 27 IU/L (ref 0–40)
Albumin/Globulin Ratio: 1.8 (ref 1.2–2.2)
Albumin: 4.4 g/dL (ref 3.7–4.7)
BUN / CREAT RATIO: 23 (ref 10–24)
BUN: 21 mg/dL (ref 8–27)
Bilirubin Total: 0.2 mg/dL (ref 0.0–1.2)
CALCIUM: 9.6 mg/dL (ref 8.6–10.2)
CO2: 22 mmol/L (ref 20–29)
CREATININE: 0.93 mg/dL (ref 0.76–1.27)
Chloride: 105 mmol/L (ref 96–106)
GFR calc Af Amer: 95 mL/min/{1.73_m2} (ref >59)
GFR, EST NON AFRICAN AMERICAN: 82 mL/min/{1.73_m2} (ref >59)
GLUCOSE: 106 mg/dL — AB (ref 65–99)
Globulin, Total: 2.4 g/dL (ref 1.5–4.5)
Potassium: 4 mmol/L (ref 3.5–5.2)
SODIUM: 142 mmol/L (ref 134–144)
TOTAL PROTEIN: 6.8 g/dL (ref 6.0–8.5)

## 2018-10-28 LAB — TSH: TSH: 3.18 u[IU]/mL (ref 0.450–4.500)

## 2018-10-28 NOTE — Telephone Encounter (Signed)
Pt returned missed call.  Please call back. ° °Thanks, °[facility] °

## 2018-10-28 NOTE — Telephone Encounter (Signed)
-----   Message from Margo Common, [place] sent at 10/28/2018  9:21 AM EST ----- All labs essentially normal with only minor variations. May use Mucinex-D with Flonase Nasal Spray for sinus congestion and use the Meclizine for dizziness if needed. If no better in a week, will need to consider a CT scan of head.

## 2018-10-28 NOTE — Telephone Encounter (Signed)
Patient was notified of results. Expressed understanding.  

## 2018-10-28 NOTE — Telephone Encounter (Signed)
LMOVM for pt to return call 

## 2018-11-03 ENCOUNTER — Telehealth: Payer: Self-pay | Admitting: Family Medicine

## 2018-11-03 DIAGNOSIS — H814 Vertigo of central origin: Secondary | ICD-10-CM

## 2018-11-03 DIAGNOSIS — R42 Dizziness and giddiness: Secondary | ICD-10-CM

## 2018-11-03 NOTE — Telephone Encounter (Signed)
Pt is calling back from Friday reporting on how his dizziness id doing.  He states he is no better and the medication that he was given is not helping  CB#  [phone]  Thanks Con Memos

## 2018-11-03 NOTE — Telephone Encounter (Signed)
Please advise 

## 2018-11-03 NOTE — Telephone Encounter (Signed)
Changed order to MRI without contrast.

## 2018-11-03 NOTE — Telephone Encounter (Signed)
Patient was advised.  

## 2018-11-03 NOTE — Telephone Encounter (Signed)
Schedule CT scan of head for dizziness.

## 2018-11-06 ENCOUNTER — Other Ambulatory Visit: Payer: Self-pay

## 2018-11-06 MED ORDER — AMLODIPINE BESYLATE 5 MG PO TABS: 5.0000 mg | ORAL_TABLET | Freq: Every day | ORAL | 3 refills | Status: DC

## 2018-11-06 NOTE — Telephone Encounter (Signed)
Patient request a refill

## 2018-11-06 NOTE — Telephone Encounter (Signed)
Please review. Thanks!  

## 2018-11-07 ENCOUNTER — Other Ambulatory Visit: Payer: Self-pay

## 2018-11-07 ENCOUNTER — Ambulatory Visit
Admission: RE | Admit: 2018-11-07 | Discharge: 2018-11-07 | Disposition: A | Discharge status: Home / Self Care | Payer: Medicare Other | Source: Ambulatory Visit | Attending: Family Medicine | Admitting: Family Medicine

## 2018-11-07 DIAGNOSIS — R42 Dizziness and giddiness: Secondary | ICD-10-CM | POA: Diagnosis not present

## 2018-11-07 DIAGNOSIS — H814 Vertigo of central origin: Secondary | ICD-10-CM | POA: Insufficient documentation

## 2018-11-10 ENCOUNTER — Telehealth: Payer: Self-pay | Admitting: *Deleted

## 2018-11-10 NOTE — Telephone Encounter (Signed)
LMOVM for pt to return call 

## 2018-11-10 NOTE — Telephone Encounter (Signed)
-----   Message from Margo Common, [place] sent at 11/07/2018  3:21 PM EST ----- Essentially normal MRI scan without any acute abnormality. Schedule PT for vertigo assessment and treatment. Recheck appointment in 2 weeks after starting PT.

## 2018-11-11 NOTE — Telephone Encounter (Signed)
Acknowledged and agree. 

## 2018-11-11 NOTE — Telephone Encounter (Signed)
Patient's wife Earlie Server was notified of results. Earlie Server stated patient stopped having the dizziness the day before he had the MRI. Earlie Server is going to have patient call us back if he wants to pursue PT.

## 2018-11-11 NOTE — Telephone Encounter (Signed)
Pt returned missed call.  Please call pt back on cell phone.  Thanks, [employer]

## 2018-11-13 ENCOUNTER — Other Ambulatory Visit: Payer: Self-pay

## 2018-11-13 DIAGNOSIS — R42 Dizziness and giddiness: Secondary | ICD-10-CM

## 2018-11-13 MED ORDER — MECLIZINE HCL 12.5 MG PO TABS: 12.5000 mg | ORAL_TABLET | Freq: Three times a day (TID) | ORAL | 0 refills | Status: DC

## 2018-11-13 NOTE — Telephone Encounter (Signed)
Patient is calling office requesting refill on meclizine sent to [facility] on [address]. KW

## 2019-01-09 ENCOUNTER — Ambulatory Visit: Payer: Self-pay

## 2019-01-28 ENCOUNTER — Other Ambulatory Visit: Payer: Self-pay

## 2019-01-28 ENCOUNTER — Ambulatory Visit (INDEPENDENT_AMBULATORY_CARE_PROVIDER_SITE_OTHER): Payer: Medicare Other

## 2019-01-28 DIAGNOSIS — Z0000 Encounter for general adult medical examination without abnormal findings: Secondary | ICD-10-CM | POA: Diagnosis not present

## 2019-01-28 NOTE — Progress Notes (Addendum)
Subjective:   Joseph Whitaker is a 73 y.o. male who presents for Medicare Annual/Subsequent preventive examination.    This visit is being conducted through telemedicine due to the COVID-19 pandemic. This patient has given me verbal consent via doximity to conduct this visit, patient states they are participating from their home address. Some vital signs may be absent or patient reported.    Patient identification: identified by name, DOB, and current address  Review of Systems:  N/A  Cardiac Risk Factors include: advanced age (>67men, >56 women);dyslipidemia;male gender;hypertension     Objective:    Vitals: There were no vitals taken for this visit.  There is no height or weight on file to calculate BMI. Unable to obtain vitals due to visit being conducted via telephonically.   Advanced Directives 01/28/2019 01/03/2018 09/18/2016 09/11/2016 12/19/2015  Does Patient Have a Medical Advance Directive? No No No No No  Does patient want to make changes to medical advance directive? No - Patient declined - - - -  Would patient like information on creating a medical advance directive? - No - Patient declined No - Patient declined No - Patient declined No - patient declined information    Tobacco Social History   Tobacco Use  Smoking Status Former Smoker  . Last attempt to quit: 09/03/1985  . Years since quitting: 33.4  Smokeless Tobacco Never Used     Counseling given: Not Answered   Clinical Intake:  Pre-visit preparation completed: Yes  Pain : No/denies pain Pain Score: 0-No pain     Nutritional Status: BMI 25 -29 Overweight Nutritional Risks: None Diabetes: No  How often do you need to have someone help you when you read instructions, pamphlets, or other written materials from your doctor or pharmacy?: 1 - Never  Interpreter Needed?: No  Information entered by :: [facility], LPN  Past Medical History:  Diagnosis Date  . Arthritis   . Hypertension   . Hypothyroidism     Past Surgical History:  Procedure Laterality Date  . NO PAST SURGERIES    . TOTAL KNEE ARTHROPLASTY Right 09/18/2016   Procedure: Right TOTAL KNEE ARTHROPLASTY;  Surgeon: Paralee Cancel, MD;  Location: [facility];  Service: Orthopedics;  Laterality: Right;  Adductor Block   Family History  Problem Relation Age of Onset  . Dementia Father   . Hyperlipidemia Sister   . Heart attack Brother   . Heart disease Maternal Grandmother   . Heart disease Maternal Grandfather   . Colon cancer Paternal Grandfather   . Arthritis Brother   . Hyperlipidemia Brother   . Hyperlipidemia Brother    Social History   Socioeconomic History  . Marital status: Married    Spouse name: Not on file  . Number of children: 4  . Years of education: Not on file  . Highest education level: 12th grade  Occupational History  . Occupation: farmer  Social Needs  . Financial resource strain: Not hard at all  . Food insecurity:    Worry: Never true    Inability: Never true  . Transportation needs:    Medical: No    Non-medical: No  Tobacco Use  . Smoking status: Former Smoker    Last attempt to quit: 09/03/1985    Years since quitting: 33.4  . Smokeless tobacco: Never Used  Substance and Sexual Activity  . Alcohol use: Not Currently    Alcohol/week: 0.0 - 1.0 standard drinks  . Drug use: No  . Sexual activity: Not on file  Lifestyle  . Physical activity:    Days per week: 0 days    Minutes per session: 0 min  . Stress: Not at all  Relationships  . Social connections:    Talks on phone: Patient refused    Gets together: Patient refused    Attends religious service: Patient refused    Active member of club or organization: Patient refused    Attends meetings of clubs or organizations: Patient refused    Relationship status: Patient refused  Other Topics Concern  . Not on file  Social History Narrative  . Not on file    Outpatient Encounter Medications as of 01/28/2019  Medication Sig  .  amLODipine (NORVASC) 5 MG tablet Take 1 tablet (5 mg total) by mouth daily.  . finasteride (PROSCAR) 5 MG tablet Take 1 tablet (5 mg total) by mouth daily.  Marland Kitchen levothyroxine (SYNTHROID, LEVOTHROID) 25 MCG tablet Take 1 tablet (25 mcg total) by mouth daily.  . meclizine (ANTIVERT) 12.5 MG tablet Take 1-2 tablets (12.5-25 mg total) by mouth 3 (three) times daily.  . Misc Natural Products (CHOLESTEROL SUPPORT PO) Take 1 tablet by mouth 2 (two) times daily.    No facility-administered encounter medications on file as of 01/28/2019.     Activities of Daily Living In your present state of health, do you have any difficulty performing the following activities: 01/28/2019  Hearing? N  Vision? N  Comment Wears eye glasses daily.   Difficulty concentrating or making decisions? N  Walking or climbing stairs? N  Dressing or bathing? N  Doing errands, shopping? N  Preparing Food and eating ? N  Using the Toilet? N  In the past six months, have you accidently leaked urine? N  Do you have problems with loss of bowel control? N  Managing your Medications? N  Managing your Finances? N  Housekeeping or managing your Housekeeping? N  Some recent data might be hidden    Patient Care Team: Chrismon, Vickki Muff, PA as PCP - General (Family Medicine)   Assessment:   This is a routine wellness examination for Joseph Whitaker.  Exercise Activities and Dietary recommendations Current Exercise Habits: The patient has a physically strenuous job, but has no regular exercise apart from work., Exercise limited by: None identified  Goals    . DIET - INCREASE WATER INTAKE     Recommend increasing water intake to 6-8 glasses a day.       Fall Risk: Fall Risk  01/28/2019 01/03/2018 12/20/2016 12/19/2015 12/19/2015  Falls in the past year? 0 No No No No    FALL RISK PREVENTION PERTAINING TO THE HOME:  Any stairs in or around the home? Yes  If so, are there any without handrails? Yes   Home free of loose throw rugs in  walkways, pet beds, electrical cords, etc? Yes  Adequate lighting in your home to reduce risk of falls? Yes   ASSISTIVE DEVICES UTILIZED TO PREVENT FALLS:  Life alert? No  Use of a cane, walker or w/c? No  Grab bars in the bathroom? No  Shower chair or bench in shower? Yes  Elevated toilet seat or a handicapped toilet? No   TIMED UP AND GO:  Was the test performed? No .    Depression Screen PHQ 2/9 Scores 01/28/2019 01/03/2018 12/20/2016 12/19/2015  PHQ - 2 Score 0 0 0 0  PHQ- 9 Score - - 0 -    Cognitive Function: Declined today.  Immunization History  Administered Date(s) Administered  . Influenza Split 08/11/2009, 08/14/2010, 08/17/2011  . Influenza, High Dose Seasonal PF 08/06/2016, 09/09/2017  . Tdap 04/04/2009  . Zoster 07/02/2008    Qualifies for Shingles Vaccine? Yes  Zostavax completed 07/02/08. Due for Shingrix. Education has been provided regarding the importance of this vaccine. Pt has been advised to call insurance company to determine out of pocket expense. Advised may also receive vaccine at local pharmacy or Health Dept. Verbalized acceptance and understanding.  Tdap: Up to date  Pneumococcal Vaccine: Due for Pneumococcal vaccine. Does the patient want to receive this vaccine today?  No . Advised may receive this vaccine at local pharmacy or Health Dept. Aware to provide a copy of the vaccination record if obtained from local pharmacy or Health Dept. Verbalized acceptance and understanding.   Screening Tests Health Maintenance  Topic Date Due  . PNA vac Low Risk Adult (1 of 2 - PCV13) 12/22/2011  . INFLUENZA VACCINE  04/04/2019  . TETANUS/TDAP  04/05/2019  . COLONOSCOPY  11/21/2020  . Hepatitis C Screening  Completed   Cancer Screenings:  Colorectal Screening: Completed 11/21/2020. Repeat every 10 years.  Lung Cancer Screening: (Low Dose CT Chest recommended if Age 38-80 years, 30 pack-year currently smoking OR have quit w/in 15years.) does not  qualify.   Additional Screening:  Hepatitis C Screening: Up to date  Vision Screening: Recommended annual ophthalmology exams for early detection of glaucoma and other disorders of the eye.  Dental Screening: Recommended annual dental exams for proper oral hygiene  Community Resource Referral:  CRR required this visit?  No        Plan:  I have personally reviewed and addressed the Medicare Annual Wellness questionnaire and have noted the following in the patient's chart:  A. Medical and social history B. Use of alcohol, tobacco or illicit drugs  C. Current medications and supplements D. Functional ability and status E.  Nutritional status F.  Physical activity G. Advance directives H. List of other physicians I.  Hospitalizations, surgeries, and ER visits in previous 12 months J.  [place] such as hearing and vision if needed, cognitive and depression L. Referrals and appointments   In addition, I have reviewed and discussed with patient certain preventive protocols, quality metrics, and best practice recommendations. A written personalized care plan for preventive services as well as general preventive health recommendations were provided to patient.   [place] Score, [place]  7/93/9030 Nurse Health Advisor   Nurse Notes: Pt declined a future order for either pneumonia vaccines.   Reviewed Nurse Health Advisor note and recommendations. Agree with documentation of screening and plan.

## 2019-01-28 NOTE — Patient Instructions (Addendum)
Joseph Whitaker , Thank you for taking time to come for your Medicare Wellness Visit. I appreciate your ongoing commitment to your health goals. Please review the following plan we discussed and let me know if I can assist you in the future.   Screening recommendations/referrals: Colonoscopy: Up to date, due 11/2020 Recommended yearly ophthalmology/optometry visit for glaucoma screening and checkup Recommended yearly dental visit for hygiene and checkup  Vaccinations: Influenza vaccine: Up to date Pneumococcal vaccine: Pt declines today.  Tdap vaccine: Up to date, due 04/2019 Shingles vaccine: Pt declines today.     Advanced directives: Advance directive discussed with you today. Even though you declined this today please call our office should you change your mind and we can give you the proper paperwork for you to fill out.  Conditions/risks identified: Continue to increase water intake to 6-8 8 oz glasses a day.   Next appointment: Pt declined scheduling a CPE at this time. Scheduled an AWV for 02/03/20.  Preventive Care 72 Years and Older, Male Preventive care refers to lifestyle choices and visits with your health care provider that can promote health and wellness. What does preventive care include?  A yearly physical exam. This is also called an annual well check.  Dental exams once or twice a year.  Routine eye exams. Ask your health care provider how often you should have your eyes checked.  Personal lifestyle choices, including:  Daily care of your teeth and gums.  Regular physical activity.  Eating a healthy diet.  Avoiding tobacco and drug use.  Limiting alcohol use.  Practicing safe sex.  Taking low doses of aspirin every day.  Taking vitamin and mineral supplements as recommended by your health care provider. What happens during an annual well check? The services and screenings done by your health care provider during your annual well check will depend on your  age, overall health, lifestyle risk factors, and family history of disease. Counseling  Your health care provider may ask you questions about your:  Alcohol use.  Tobacco use.  Drug use.  Emotional well-being.  Home and relationship well-being.  Sexual activity.  Eating habits.  History of falls.  Memory and ability to understand (cognition).  Work and work Statistician. Screening  You may have the following tests or measurements:  Height, weight, and BMI.  Blood pressure.  Lipid and cholesterol levels. These may be checked every 5 years, or more frequently if you are over 26 years old.  Skin check.  Lung cancer screening. You may have this screening every year starting at age 32 if you have a 30-pack-year history of smoking and currently smoke or have quit within the past 15 years.  Fecal occult blood test (FOBT) of the stool. You may have this test every year starting at age 75.  Flexible sigmoidoscopy or colonoscopy. You may have a sigmoidoscopy every 5 years or a colonoscopy every 10 years starting at age 16.  Prostate cancer screening. Recommendations will vary depending on your family history and other risks.  Hepatitis C blood test.  Hepatitis B blood test.  Sexually transmitted disease (STD) testing.  Diabetes screening. This is done by checking your blood sugar (glucose) after you have not eaten for a while (fasting). You may have this done every 1-3 years.  Abdominal aortic aneurysm (AAA) screening. You may need this if you are a current or former smoker.  Osteoporosis. You may be screened starting at age 79 if you are at high risk. Talk with your  health care provider about your test results, treatment options, and if necessary, the need for more tests. Vaccines  Your health care provider may recommend certain vaccines, such as:  Influenza vaccine. This is recommended every year.  Tetanus, diphtheria, and acellular pertussis (Tdap, Td) vaccine. You  may need a Td booster every 10 years.  Zoster vaccine. You may need this after age 52.  Pneumococcal 13-valent conjugate (PCV13) vaccine. One dose is recommended after age 65.  Pneumococcal polysaccharide (PPSV23) vaccine. One dose is recommended after age 31. Talk to your health care provider about which screenings and vaccines you need and how often you need them. This information is not intended to replace advice given to you by your health care provider. Make sure you discuss any questions you have with your health care provider. Document Released: 09/16/2015 Document Revised: 05/09/2016 Document Reviewed: 06/21/2015 Elsevier Interactive Patient Education  2017 [place] Prevention in the Home Falls can cause injuries. They can happen to people of all ages. There are many things you can do to make your home safe and to help prevent falls. What can I do on the outside of my home?  Regularly fix the edges of walkways and driveways and fix any cracks.  Remove anything that might make you trip as you walk through a door, such as a raised step or threshold.  Trim any bushes or trees on the path to your home.  Use bright outdoor lighting.  Clear any walking paths of anything that might make someone trip, such as rocks or tools.  Regularly check to see if handrails are loose or broken. Make sure that both sides of any steps have handrails.  Any raised decks and porches should have guardrails on the edges.  Have any leaves, snow, or ice cleared regularly.  Use sand or salt on walking paths during winter.  Clean up any spills in your garage right away. This includes oil or grease spills. What can I do in the bathroom?  Use night lights.  Install grab bars by the toilet and in the tub and shower. Do not use towel bars as grab bars.  Use non-skid mats or decals in the tub or shower.  If you need to sit down in the shower, use a plastic, non-slip stool.  Keep the floor  dry. Clean up any water that spills on the floor as soon as it happens.  Remove soap buildup in the tub or shower regularly.  Attach bath mats securely with double-sided non-slip rug tape.  Do not have throw rugs and other things on the floor that can make you trip. What can I do in the bedroom?  Use night lights.  Make sure that you have a light by your bed that is easy to reach.  Do not use any sheets or blankets that are too big for your bed. They should not hang down onto the floor.  Have a firm chair that has side arms. You can use this for support while you get dressed.  Do not have throw rugs and other things on the floor that can make you trip. What can I do in the kitchen?  Clean up any spills right away.  Avoid walking on wet floors.  Keep items that you use a lot in easy-to-reach places.  If you need to reach something above you, use a strong step stool that has a grab bar.  Keep electrical cords out of the way.  Do not use  floor polish or wax that makes floors slippery. If you must use wax, use non-skid floor wax.  Do not have throw rugs and other things on the floor that can make you trip. What can I do with my stairs?  Do not leave any items on the stairs.  Make sure that there are handrails on both sides of the stairs and use them. Fix handrails that are broken or loose. Make sure that handrails are as long as the stairways.  Check any carpeting to make sure that it is firmly attached to the stairs. Fix any carpet that is loose or worn.  Avoid having throw rugs at the top or bottom of the stairs. If you do have throw rugs, attach them to the floor with carpet tape.  Make sure that you have a light switch at the top of the stairs and the bottom of the stairs. If you do not have them, ask someone to add them for you. What else can I do to help prevent falls?  Wear shoes that:  Do not have high heels.  Have rubber bottoms.  Are comfortable and fit you  well.  Are closed at the toe. Do not wear sandals.  If you use a stepladder:  Make sure that it is fully opened. Do not climb a closed stepladder.  Make sure that both sides of the stepladder are locked into place.  Ask someone to hold it for you, if possible.  Clearly mark and make sure that you can see:  Any grab bars or handrails.  First and last steps.  Where the edge of each step is.  Use tools that help you move around (mobility aids) if they are needed. These include:  Canes.  Walkers.  Scooters.  Crutches.  Turn on the lights when you go into a dark area. Replace any light bulbs as soon as they burn out.  Set up your furniture so you have a clear path. Avoid moving your furniture around.  If any of your floors are uneven, fix them.  If there are any pets around you, be aware of where they are.  Review your medicines with your doctor. Some medicines can make you feel dizzy. This can increase your chance of falling. Ask your doctor what other things that you can do to help prevent falls. This information is not intended to replace advice given to you by your health care provider. Make sure you discuss any questions you have with your health care provider. Document Released: 06/16/2009 Document Revised: 01/26/2016 Document Reviewed: 09/24/2014 Elsevier Interactive Patient Education  2017 [employer].

## 2019-04-02 ENCOUNTER — Telehealth: Payer: Self-pay | Admitting: Family Medicine

## 2019-04-02 MED ORDER — LEVOTHYROXINE SODIUM 25 MCG PO TABS: 25.0000 ug | ORAL_TABLET | Freq: Every day | ORAL | 1 refills | Status: DC

## 2019-04-02 NOTE — Telephone Encounter (Signed)
Pt needs a refill on   Levothyroxine [place]

## 2019-05-04 DIAGNOSIS — H524 Presbyopia: Secondary | ICD-10-CM | POA: Diagnosis not present

## 2019-05-04 DIAGNOSIS — H35033 Hypertensive retinopathy, bilateral: Secondary | ICD-10-CM | POA: Diagnosis not present

## 2019-05-04 DIAGNOSIS — I10 Essential (primary) hypertension: Secondary | ICD-10-CM | POA: Diagnosis not present

## 2019-05-04 DIAGNOSIS — H25813 Combined forms of age-related cataract, bilateral: Secondary | ICD-10-CM | POA: Diagnosis not present

## 2019-06-12 DIAGNOSIS — D2262 Melanocytic nevi of left upper limb, including shoulder: Secondary | ICD-10-CM | POA: Diagnosis not present

## 2019-06-12 DIAGNOSIS — D485 Neoplasm of uncertain behavior of skin: Secondary | ICD-10-CM | POA: Diagnosis not present

## 2019-06-12 DIAGNOSIS — X32XXXA Exposure to sunlight, initial encounter: Secondary | ICD-10-CM | POA: Diagnosis not present

## 2019-06-12 DIAGNOSIS — C44629 Squamous cell carcinoma of skin of left upper limb, including shoulder: Secondary | ICD-10-CM | POA: Diagnosis not present

## 2019-06-12 DIAGNOSIS — D2261 Melanocytic nevi of right upper limb, including shoulder: Secondary | ICD-10-CM | POA: Diagnosis not present

## 2019-06-12 DIAGNOSIS — L728 Other follicular cysts of the skin and subcutaneous tissue: Secondary | ICD-10-CM | POA: Diagnosis not present

## 2019-06-12 DIAGNOSIS — Z85820 Personal history of malignant melanoma of skin: Secondary | ICD-10-CM | POA: Diagnosis not present

## 2019-06-12 DIAGNOSIS — L570 Actinic keratosis: Secondary | ICD-10-CM | POA: Diagnosis not present

## 2019-06-12 DIAGNOSIS — D2271 Melanocytic nevi of right lower limb, including hip: Secondary | ICD-10-CM | POA: Diagnosis not present

## 2019-07-20 DIAGNOSIS — C44629 Squamous cell carcinoma of skin of left upper limb, including shoulder: Secondary | ICD-10-CM | POA: Diagnosis not present

## 2019-08-10 ENCOUNTER — Other Ambulatory Visit: Payer: Self-pay

## 2019-08-10 ENCOUNTER — Ambulatory Visit (INDEPENDENT_AMBULATORY_CARE_PROVIDER_SITE_OTHER): Payer: Medicare Other | Admitting: Family Medicine

## 2019-08-10 VITALS — BP 122/70 | HR 73 | Temp 97.3°F | Ht 69.0 in | Wt 176.0 lb

## 2019-08-10 DIAGNOSIS — I10 Essential (primary) hypertension: Secondary | ICD-10-CM | POA: Diagnosis not present

## 2019-08-10 DIAGNOSIS — B351 Tinea unguium: Secondary | ICD-10-CM | POA: Diagnosis not present

## 2019-08-10 DIAGNOSIS — Z23 Encounter for immunization: Secondary | ICD-10-CM

## 2019-08-10 DIAGNOSIS — E7800 Pure hypercholesterolemia, unspecified: Secondary | ICD-10-CM | POA: Diagnosis not present

## 2019-08-10 DIAGNOSIS — N400 Enlarged prostate without lower urinary tract symptoms: Secondary | ICD-10-CM | POA: Diagnosis not present

## 2019-08-10 DIAGNOSIS — E039 Hypothyroidism, unspecified: Secondary | ICD-10-CM

## 2019-08-10 LAB — IFOBT (OCCULT BLOOD): IFOBT: NEGATIVE

## 2019-08-10 NOTE — Progress Notes (Signed)
Joseph Whitaker  MRN: [id number] DOB: [birth year]  Subjective:  HPI   The patient is a 73 year old male who presents for follow up after having his Medicare Annual Wellness with the [facility] in May.    Immunization History  Administered Date(s) Administered  . Influenza Split 08/11/2009, 08/14/2010, 08/17/2011  . Influenza, High Dose Seasonal PF 08/06/2016, 09/09/2017  . Tdap 04/04/2009  . Zoster 07/02/2008   11/22/10 Colonoscopy-no polyps.  Patient Active Problem List   Diagnosis Date Noted  . Enlarged prostate on rectal examination 12/20/2016  . S/P right TKA 09/18/2016  . S/P knee replacement 09/18/2016  . Lesion of penis 11/23/2015  . Pneumococcal vaccination declined 11/23/2015  . Cannot sleep 12/29/2009  . Excessive urination at night 07/02/2008  . Need for vaccination 07/02/2008  . CD (contact dermatitis) 01/19/2008  . Essential (primary) hypertension 06/16/2007  . HLD (hyperlipidemia) 08/10/2003    Past Medical History:  Diagnosis Date  . Arthritis   . Hypertension   . Hypothyroidism     Social History   Socioeconomic History  . Marital status: Married    Spouse name: Not on file  . Number of children: 4  . Years of education: Not on file  . Highest education level: 12th grade  Occupational History  . Occupation: farmer  Social Needs  . Financial resource strain: Not hard at all  . Food insecurity    Worry: Never true    Inability: Never true  . Transportation needs    Medical: No    Non-medical: No  Tobacco Use  . Smoking status: Former Smoker    Quit date: 09/03/1985    Years since quitting: 33.9  . Smokeless tobacco: Never Used  Substance and Sexual Activity  . Alcohol use: Not Currently    Alcohol/week: 0.0 - 1.0 standard drinks  . Drug use: No  . Sexual activity: Not on file  Lifestyle  . Physical activity    Days per week: 0 days    Minutes per session: 0 min  . Stress: Not at all  Relationships  . Social Herbalist on phone: Patient refused    Gets together: Patient refused    Attends religious service: Patient refused    Active member of club or organization: Patient refused    Attends meetings of clubs or organizations: Patient refused    Relationship status: Patient refused  . Intimate partner violence    Fear of current or ex partner: Patient refused    Emotionally abused: Patient refused    Physically abused: Patient refused    Forced sexual activity: Patient refused  Other Topics Concern  . Not on file  Social History Narrative  . Not on file   Family History  Problem Relation Age of Onset  . Dementia Father   . Hyperlipidemia Sister   . Heart attack Brother   . Heart disease Maternal Grandmother   . Heart disease Maternal Grandfather   . Colon cancer Paternal Grandfather   . Arthritis Brother   . Hyperlipidemia Brother   . Hyperlipidemia Brother    Outpatient Encounter Medications as of 08/10/2019  Medication Sig  . amLODipine (NORVASC) 5 MG tablet Take 1 tablet (5 mg total) by mouth daily.  . finasteride (PROSCAR) 5 MG tablet Take 1 tablet (5 mg total) by mouth daily.  Marland Kitchen levothyroxine (SYNTHROID) 25 MCG tablet Take 1 tablet (25 mcg total) by mouth daily.  . meclizine (ANTIVERT) 12.5 MG tablet Take  1-2 tablets (12.5-25 mg total) by mouth 3 (three) times daily.  . Misc Natural Products (CHOLESTEROL SUPPORT PO) Take 1 tablet by mouth 2 (two) times daily.    No facility-administered encounter medications on file as of 08/10/2019.     Allergies  Allergen Reactions  . Red Yeast Rice [Cholestin] Other (See Comments)    Dizziness  . Statins Other (See Comments)    Weight loss, malaise.    Review of Systems  Constitutional: Negative.   HENT: Negative.   Eyes: Negative.        Eyes itching  Respiratory: Negative.   Cardiovascular: Negative.   Gastrointestinal: Negative.   Genitourinary: Negative.        Difficulty urinating  Musculoskeletal: Negative.   Skin:  Negative.   Neurological: Negative.   Endo/Heme/Allergies: Negative.   Psychiatric/Behavioral: Negative.     Objective:  BP 122/70 (BP Location: Right Arm, Patient Position: Sitting, Cuff Size: Normal)   Pulse 73   Temp (!) 97.3 F (36.3 C) (Skin)   Ht 5\' 9"  (1.753 m)   Wt 176 lb (79.8 kg)   SpO2 99%   BMI 25.99 kg/m   Physical Exam  Constitutional: He is oriented to person, place, and time and well-developed, well-nourished, and in no distress.  HENT:  Head: Normocephalic.  Eyes: Conjunctivae are normal.  Neck: Neck supple.  Cardiovascular: Normal rate and regular rhythm.  Pulmonary/Chest: Effort normal and breath sounds normal.  Abdominal: Soft.  Genitourinary:    Penis and rectum normal.     Genitourinary Comments: Enlarged prostate without nodules.   Musculoskeletal: Normal range of motion.  Neurological: He is alert and oriented to person, place, and time.  Skin: No rash noted.  Irregular and thickened nails of both hands and great toes.  Psychiatric: Mood, affect and judgment normal.    Assessment and Plan :   1. Essential (primary) hypertension Well controlled and tolerating Amlodipine 5 mg qd without edema. No chest pains or dyspnea. Will check routine labs and continue present dosage. Annual Wellness Screening essentially normal. Declines pneumonia vaccinations but achieved flu shot and first shingles vaccination this Fall.  - CBC with Differential/Platelet - Comprehensive metabolic panel - TSH  2. Pure hypercholesterolemia LDL cholesterol a little over 100 last year which was an improvement over the previous year. Continue low fat diet and recheck CMP, Lipid Panel with TSH. - Comprehensive metabolic panel - Lipid Panel With LDL/HDL Ratio - TSH  3. Enlarged prostate on rectal examination DRE shows prominent enlargement without nodules in prostate. Stool negative for blood by OC-Light. Nocturia 2-3 times a night. Tolerating the Finasteride 5 mg qd. Recheck  CMP and PSA. - Comprehensive metabolic panel - PSA  4. Acquired hypothyroidism No nodules, pretibial edema, constipation, cold intolerance or fatigue. No thyroid supplementation at the present. No history of thyroid surgery or RAI treatments.  Recheck TSH and T4 to assess function. - TSH - T4  5. Onychomycosis Left great toe nail has fallen off and the right great toe nail is thick and irregular. All finger nails are irregular and has proximal thinning of the thumb nails. Suspect onychomycosis and wants to get on Lamisil tablets. Will check liver function before starting. - CBC with Differential/Platelet - Comprehensive metabolic pane

## 2019-08-11 ENCOUNTER — Telehealth: Payer: Self-pay

## 2019-08-11 LAB — COMPREHENSIVE METABOLIC PANEL
ALT: 13 IU/L (ref 0–44)
AST: 18 IU/L (ref 0–40)
Albumin/Globulin Ratio: 1.7 (ref 1.2–2.2)
Albumin: 4.3 g/dL (ref 3.7–4.7)
Alkaline Phosphatase: 68 IU/L (ref 39–117)
BUN/Creatinine Ratio: 18 (ref 10–24)
BUN: 17 mg/dL (ref 8–27)
Bilirubin Total: 0.4 mg/dL (ref 0.0–1.2)
CO2: 23 mmol/L (ref 20–29)
Calcium: 9.7 mg/dL (ref 8.6–10.2)
Chloride: 105 mmol/L (ref 96–106)
Creatinine, Ser: 0.97 mg/dL (ref 0.76–1.27)
GFR calc Af Amer: 90 mL/min/{1.73_m2} (ref >59)
GFR calc non Af Amer: 78 mL/min/{1.73_m2} (ref >59)
Globulin, Total: 2.5 g/dL (ref 1.5–4.5)
Glucose: 89 mg/dL (ref 65–99)
Potassium: 4.5 mmol/L (ref 3.5–5.2)
Sodium: 140 mmol/L (ref 134–144)
Total Protein: 6.8 g/dL (ref 6.0–8.5)

## 2019-08-11 LAB — LIPID PANEL WITH LDL/HDL RATIO
Cholesterol, Total: 183 mg/dL (ref 100–199)
HDL: 40 mg/dL (ref >39)
LDL Chol Calc (NIH): 128 mg/dL — ABNORMAL HIGH (ref 0–99)
LDL/HDL Ratio: 3.2 ratio (ref 0.0–3.6)
Triglycerides: 81 mg/dL (ref 0–149)
VLDL Cholesterol Cal: 15 mg/dL (ref 5–40)

## 2019-08-11 LAB — T4: T4, Total: 6.2 ug/dL (ref 4.5–12.0)

## 2019-08-11 LAB — CBC WITH DIFFERENTIAL/PLATELET
Basophils Absolute: 0 10*3/uL (ref 0.0–0.2)
Basos: 1 %
EOS (ABSOLUTE): 0 10*3/uL (ref 0.0–0.4)
Eos: 1 %
Hematocrit: 39.2 % (ref 37.5–51.0)
Hemoglobin: 13.1 g/dL (ref 13.0–17.7)
Immature Grans (Abs): 0 10*3/uL (ref 0.0–0.1)
Immature Granulocytes: 0 %
Lymphocytes Absolute: 0.9 10*3/uL (ref 0.7–3.1)
Lymphs: 21 %
MCH: 31.1 pg (ref 26.6–33.0)
MCHC: 33.4 g/dL (ref 31.5–35.7)
MCV: 93 fL (ref 79–97)
Monocytes Absolute: 0.5 10*3/uL (ref 0.1–0.9)
Monocytes: 11 %
Neutrophils Absolute: 2.8 10*3/uL (ref 1.4–7.0)
Neutrophils: 66 %
Platelets: 286 10*3/uL (ref 150–450)
RBC: 4.21 x10E6/uL (ref 4.14–5.80)
RDW: 12.5 % (ref 11.6–15.4)
WBC: 4.2 10*3/uL (ref 3.4–10.8)

## 2019-08-11 LAB — PSA: Prostate Specific Ag, Serum: 1.4 ng/mL (ref 0.0–4.0)

## 2019-08-11 LAB — TSH: TSH: 4.39 u[IU]/mL (ref 0.450–4.500)

## 2019-08-11 NOTE — Telephone Encounter (Signed)
Result note read to patient; verbalizes understanding. Aware of F/U labs 3-4 months.  Pt requesting copy of lab work sent to his home, address verified.

## 2019-08-11 NOTE — Telephone Encounter (Signed)
LMTCB.  OK for [facility] Triage to give results

## 2019-08-11 NOTE — Telephone Encounter (Signed)
-----   Message from Margo Common, [place] sent at 08/11/2019  8:04 AM EST ----- All blood tests essentially normal except LDL cholesterol above goal of <100. Awaiting final thyroxine level. Should work on low fat diet and add Red Yeast Rice with Co-Q 10 tablet daily. Recheck progress of cholesterol control in 3-4 months. May need statin.

## 2019-08-14 ENCOUNTER — Telehealth: Payer: Self-pay

## 2019-08-14 NOTE — Telephone Encounter (Signed)
Copied from [place] [id number]. Topic: General - Other >> Aug 14, 2019 10:00 AM Antonieta Iba C wrote:  Reason for CRM: pt called in to speak with providers assistant. Pt is requesting a call back at:   [phone]

## 2019-08-14 NOTE — Telephone Encounter (Signed)
Patient's wife said that you were going to treat the patient for his foot fungus but had to check on some topical medications.  They wanted to know if you have found one for him

## 2019-08-14 NOTE — Telephone Encounter (Signed)
Most over the counter or topical nail treatments are no better than using Vick's Vapor Rub on the nails twice a day (about a 20% effectiveness). The Lamisil (oral tablets) once a day for 12 weeks has about a 76% effectiveness, but, we should recheck liver enzymes every 6 weeks until finished. This condition may take longer for new healthy nails to grow out. Let us known which you prefer to try.

## 2019-08-18 NOTE — Telephone Encounter (Signed)
LMTCB  Ok for [facility] Triage to give information

## 2019-08-18 NOTE — Telephone Encounter (Signed)
Pt. Given information on treatments. States he would like to try the Vicks Vapor rub first, and see if it works. Will let PCP know how it works.

## 2019-09-29 ENCOUNTER — Telehealth: Payer: Self-pay

## 2019-09-29 ENCOUNTER — Telehealth: Payer: Self-pay | Admitting: Family Medicine

## 2019-09-29 MED ORDER — LEVOTHYROXINE SODIUM 25 MCG PO TABS: 25.0000 ug | ORAL_TABLET | Freq: Every day | ORAL | 1 refills | Status: DC

## 2019-09-29 NOTE — Telephone Encounter (Signed)
rx refill levothyroxine (SYNTHROID) 25 MCG tablet  PHARMACY [id number] - [place], [place]  [address] [place], [place] [address]

## 2019-09-29 NOTE — Telephone Encounter (Signed)
Hey, can you take care of this for me.  Thanks

## 2019-09-29 NOTE — Telephone Encounter (Signed)
Copied from [place] [id number]. Topic: General - Inquiry >> Sep 29, 2019  9:30 AM Mathis Bud wrote: Reason for CRM: Patient is requesting his last lab work he had done be sent to his home address: [place] [address]

## 2019-10-29 ENCOUNTER — Other Ambulatory Visit: Payer: Self-pay | Admitting: Family Medicine

## 2019-11-27 DIAGNOSIS — D2271 Melanocytic nevi of right lower limb, including hip: Secondary | ICD-10-CM | POA: Diagnosis not present

## 2019-11-27 DIAGNOSIS — L570 Actinic keratosis: Secondary | ICD-10-CM | POA: Diagnosis not present

## 2019-11-27 DIAGNOSIS — L720 Epidermal cyst: Secondary | ICD-10-CM | POA: Diagnosis not present

## 2019-11-27 DIAGNOSIS — D225 Melanocytic nevi of trunk: Secondary | ICD-10-CM | POA: Diagnosis not present

## 2019-11-27 DIAGNOSIS — L821 Other seborrheic keratosis: Secondary | ICD-10-CM | POA: Diagnosis not present

## 2019-11-27 DIAGNOSIS — D485 Neoplasm of uncertain behavior of skin: Secondary | ICD-10-CM | POA: Diagnosis not present

## 2019-11-27 DIAGNOSIS — Z85820 Personal history of malignant melanoma of skin: Secondary | ICD-10-CM | POA: Diagnosis not present

## 2019-11-27 DIAGNOSIS — Z85828 Personal history of other malignant neoplasm of skin: Secondary | ICD-10-CM | POA: Diagnosis not present

## 2019-11-27 DIAGNOSIS — X32XXXA Exposure to sunlight, initial encounter: Secondary | ICD-10-CM | POA: Diagnosis not present

## 2019-11-27 DIAGNOSIS — L089 Local infection of the skin and subcutaneous tissue, unspecified: Secondary | ICD-10-CM | POA: Diagnosis not present

## 2019-11-27 DIAGNOSIS — R208 Other disturbances of skin sensation: Secondary | ICD-10-CM | POA: Diagnosis not present

## 2019-12-28 ENCOUNTER — Telehealth: Payer: Self-pay

## 2019-12-28 NOTE — Telephone Encounter (Signed)
Copied from [place] [id number]. Topic: Clinical - COVID Pre-Screen >> Dec 28, 2019 10:18 AM Oneta Rack wrote: 1. To the best of your knowledge, have you been in close contact with anyone with a confirmed diagnosis of COVID 19?  no  If no - Proceed to next question; If yes - Schedule patient for a virtual visi 2. Have you had any one or more of the following: fever, chills, cough, shortness of breath or any flu-like symptoms?  no  If no - Proceed to next question; If yes - Schedule patient for a virtual visit  3. Have you been diagnosed with or have a previous diagnosis of COVID 19?  no  If no - Proceed to next question; If yes - Schedule patient for a virtual visit  4. I am going to go over a few other symptoms with you. Please let me know if you are experiencing any of the following: no  Ear, nose or throat discomfort  A sore throat  Headache  Muscle pain  Diarrhea  Loss of taste or smell  If no - Continue with scheduling process; If yes - Document in scheduling notes   Thank you for answering these questions. Please know we will ask you these questions or similar questions when you arrive for your appointment and again it's how we are keeping everyone safe. Also, to keep you safe, please use the provided hand sanitizer when you enter the building. Mar Daring, we are asking everyone in the building to wear a mask because they help [place] prevent the spread of germs.   Do you have a mask of your own, if not, we are happy to provide one for you. The last thing I want to go over with you is the no visitor guidelines. This means no one can attend the appointment with you unless you need physical assistance. I understand this may be different from your past appointments and I know this may be difficult but please know if someone is driving you we are happy to call them for you once your appointment is over.

## 2019-12-29 ENCOUNTER — Other Ambulatory Visit: Payer: Self-pay

## 2019-12-29 ENCOUNTER — Encounter: Payer: Self-pay | Admitting: Family Medicine

## 2019-12-29 ENCOUNTER — Ambulatory Visit (INDEPENDENT_AMBULATORY_CARE_PROVIDER_SITE_OTHER): Payer: Medicare Other | Admitting: Family Medicine

## 2019-12-29 VITALS — BP 118/72 | HR 69 | Temp 97.1°F | Wt 170.0 lb

## 2019-12-29 DIAGNOSIS — R42 Dizziness and giddiness: Secondary | ICD-10-CM

## 2019-12-29 DIAGNOSIS — I10 Essential (primary) hypertension: Secondary | ICD-10-CM | POA: Diagnosis not present

## 2019-12-29 NOTE — Patient Instructions (Signed)
Dizziness Dizziness is a common problem. It is a feeling of unsteadiness or light-headedness. You may feel like you are about to faint. Dizziness can lead to injury if you stumble or fall. Anyone can become dizzy, but dizziness is more common in older adults. This condition can be caused by a number of things, including medicines, dehydration, or illness. Follow these instructions at home: Eating and drinking  Drink enough fluid to keep your urine clear or pale yellow. This helps to keep you from becoming dehydrated. Try to drink more clear fluids, such as water.  Do not drink alcohol.  Limit your caffeine intake if told to do so by your health care provider. Check ingredients and nutrition facts to see if a food or beverage contains caffeine.  Limit your salt (sodium) intake if told to do so by your health care provider. Check ingredients and nutrition facts to see if a food or beverage contains sodium. Activity  Avoid making quick movements. ? Rise slowly from chairs and steady yourself until you feel okay. ? In the morning, first sit up on the side of the bed. When you feel okay, stand slowly while you hold onto something until you know that your balance is fine.  If you need to stand in one place for a long time, move your legs often. Tighten and relax the muscles in your legs while you are standing.  Do not drive or use heavy machinery if you feel dizzy.  Avoid bending down if you feel dizzy. Place items in your home so that they are easy for you to reach without leaning over. Lifestyle  Do not use any products that contain nicotine or tobacco, such as cigarettes and e-cigarettes. If you need help quitting, ask your health care provider.  Try to reduce your stress level by using methods such as yoga or meditation. Talk with your health care provider if you need help to manage your stress. General instructions  Watch your dizziness for any changes.  Take over-the-counter and  prescription medicines only as told by your health care provider. Talk with your health care provider if you think that your dizziness is caused by a medicine that you are taking.  Tell a friend or a family member that you are feeling dizzy. If he or she notices any changes in your behavior, have this person call your health care provider.  Keep all follow-up visits as told by your health care provider. This is important. Contact a health care provider if:  Your dizziness does not go away.  Your dizziness or light-headedness gets worse.  You feel nauseous.  You have reduced hearing.  You have new symptoms.  You are unsteady on your feet or you feel like the room is spinning. Get help right away if:  You vomit or have diarrhea and are unable to eat or drink anything.  You have problems talking, walking, swallowing, or using your arms, hands, or legs.  You feel generally weak.  You are not thinking clearly or you have trouble forming sentences. It may take a friend or family member to notice this.  You have chest pain, abdominal pain, shortness of breath, or sweating.  Your vision changes.  You have any bleeding.  You have a severe headache.  You have neck pain or a stiff neck.  You have a fever. These symptoms may represent a serious problem that is an emergency. Do not wait to see if the symptoms will go away. Get medical help   right away. Call your local emergency services (911 in the [place] Do not drive yourself to the hospital. Summary  Dizziness is a feeling of unsteadiness or light-headedness. This condition can be caused by a number of things, including medicines, dehydration, or illness.  Anyone can become dizzy, but dizziness is more common in older adults.  Drink enough fluid to keep your urine clear or pale yellow. Do not drink alcohol.  Avoid making quick movements if you feel dizzy. Monitor your dizziness for any changes. This information is not intended to  replace advice given to you by your health care provider. Make sure you discuss any questions you have with your health care provider. Document Revised: 08/23/2017 Document Reviewed: 09/22/2016 Elsevier Patient Education  2020 Elsevier Inc.  

## 2019-12-29 NOTE — Progress Notes (Addendum)
Established Patient Office Visit  Subjective:  Patient ID: Joseph Whitaker, male    DOB: 1946-06-16  Age: 74 y.o. MRN: [id number]  CC:  Chief Complaint  Patient presents with  . Dizziness    HPI Dizziness  He reports dizziness for about 2 weeks. He describes that it occurs with movement, including bending.  He states he has a history of dizziness where he has an episode about once a year.  The episodes will usually last about 2 weeks.   He has Meclizine on his medicine.  However, he does not remember if this has helped in the past.  Patient would like to have his carotid arteries checked.   Wt Readings from Last 3 Encounters:  12/29/19 170 lb (77.1 kg)  08/10/19 176 lb (79.8 kg)  10/27/18 181 lb (82.1 kg)    BP Readings from Last 3 Encounters:  12/29/19 118/72  08/10/19 122/70  10/27/18 110/60      Lab Results  Component Value Date   WBC 4.2 08/10/2019   HGB 13.1 08/10/2019   HCT 39.2 08/10/2019   MCV 93 08/10/2019   PLT 286 08/10/2019   Lab Results  Component Value Date   NA 140 08/10/2019   K 4.5 08/10/2019   CO2 23 08/10/2019   BUN 17 08/10/2019   CREATININE 0.97 08/10/2019   CALCIUM 9.7 08/10/2019   GLUCOSE 89 08/10/2019     ---------------------------------------------------------------------------------------------------   Past Medical History:  Diagnosis Date  . Arthritis   . Hypertension   . Hypothyroidism     Past Surgical History:  Procedure Laterality Date  . NO PAST SURGERIES    . TOTAL KNEE ARTHROPLASTY Right 09/18/2016   Procedure: Right TOTAL KNEE ARTHROPLASTY;  Surgeon: Paralee Cancel, MD;  Location: [facility];  Service: Orthopedics;  Laterality: Right;  Adductor Block    Family History  Problem Relation Age of Onset  . Dementia Father   . Hyperlipidemia Sister   . Heart attack Brother   . Heart disease Maternal Grandmother   . Heart disease Maternal Grandfather   . Colon cancer Paternal Grandfather   . Arthritis Brother   .  Hyperlipidemia Brother   . Hyperlipidemia Brother     Social History   Socioeconomic History  . Marital status: Married    Spouse name: Not on file  . Number of children: 4  . Years of education: Not on file  . Highest education level: 12th grade  Occupational History  . Occupation: farmer  Tobacco Use  . Smoking status: Former Smoker    Quit date: 09/03/1985    Years since quitting: 34.3  . Smokeless tobacco: Never Used  Substance and Sexual Activity  . Alcohol use: Not Currently    Alcohol/week: 0.0 - 1.0 standard drinks  . Drug use: No  . Sexual activity: Not on file  Other Topics Concern  . Not on file  Social History Narrative  . Not on file   Social Determinants of Health   Financial Resource Strain:   . Difficulty of Paying Living Expenses:   Food Insecurity:   . Worried About Charity fundraiser in the Last Year:   . Arboriculturist in the Last Year:   Transportation Needs:   . Film/video editor (Medical):   Marland Kitchen Lack of Transportation (Non-Medical):   Physical Activity: Inactive  . Days of Exercise per Week: 0 days  . Minutes of Exercise per Session: 0 min  Stress:   . Feeling  of Stress :   Social Connections: Unknown  . Frequency of Communication with Friends and Family: Patient refused  . Frequency of Social Gatherings with Friends and Family: Patient refused  . Attends Religious Services: Patient refused  . Active Member of Clubs or Organizations: Patient refused  . Attends Archivist Meetings: Patient refused  . Marital Status: Patient refused  Intimate Partner Violence: Unknown  . Fear of Current or Ex-Partner: Patient refused  . Emotionally Abused: Patient refused  . Physically Abused: Patient refused  . Sexually Abused: Patient refused    Outpatient Medications Prior to Visit  Medication Sig Dispense Refill  . amLODipine (NORVASC) 5 MG tablet TAKE 1 TABLET BY MOUTH EVERY DAY 90 tablet 3  . finasteride (PROSCAR) 5 MG tablet Take 1  tablet (5 mg total) by mouth daily. 90 tablet 3  . levothyroxine (SYNTHROID) 25 MCG tablet Take 1 tablet (25 mcg total) by mouth daily. 90 tablet 1  . Misc Natural Products (CHOLESTEROL SUPPORT PO) Take 1 tablet by mouth 2 (two) times daily.     . meclizine (ANTIVERT) 12.5 MG tablet Take 1-2 tablets (12.5-25 mg total) by mouth 3 (three) times daily. (Patient not taking: Reported on 12/29/2019) 30 tablet 0   No facility-administered medications prior to visit.    Allergies  Allergen Reactions  . Red Yeast Rice [Cholestin] Other (See Comments)    Dizziness  . Statins Other (See Comments)    Weight loss, malaise.    ROS Review of Systems  Constitutional: Negative for chills and fever.  HENT: Negative for congestion, ear pain, hearing loss, sinus pressure, sinus pain and tinnitus.   Respiratory: Negative for shortness of breath.   Cardiovascular: Negative for chest pain.  Gastrointestinal: Positive for nausea (a little bit when he has the episode).  Neurological: Positive for dizziness and light-headedness. Negative for headaches.      Objective:    Physical Exam  Constitutional: He is oriented to person, place, and time. He appears well-developed and well-nourished. No distress.  HENT:  Head: Normocephalic and atraumatic.  Right Ear: Hearing normal.  Left Ear: Hearing normal.  Nose: Nose normal.  Eyes: Conjunctivae and lids are normal. Right eye exhibits no discharge. Left eye exhibits no discharge. No scleral icterus.  Cardiovascular: Normal rate.  Pulmonary/Chest: Effort normal and breath sounds normal. No respiratory distress.  Abdominal: Soft. Bowel sounds are normal.  Musculoskeletal:        General: Normal range of motion.     Cervical back: Neck supple.  Neurological: He is alert and oriented to person, place, and time. He displays normal reflexes. No cranial nerve deficit. Coordination normal.  Negative Hallpike maneuver and no orthostatic changes to balance.  Skin:  Skin is intact. No lesion and no rash noted.  Psychiatric: He has a normal mood and affect. His speech is normal and behavior is normal. Thought content normal.    BP 118/72 (BP Location: Right Arm, Patient Position: Sitting, Cuff Size: Normal)   Pulse 69   Temp (!) 97.1 F (36.2 C) (Skin)   Wt 170 lb (77.1 kg)   SpO2 98%   BMI 25.10 kg/m  Wt Readings from Last 3 Encounters:  12/29/19 170 lb (77.1 kg)  08/10/19 176 lb (79.8 kg)  10/27/18 181 lb (82.1 kg)   Health Maintenance Due  Topic Date Due  . COVID-19 Vaccine (1) Never done  . PNA vac Low Risk Adult (1 of 2 - PCV13) Never done  . TETANUS/TDAP  04/05/2019  There are no preventive care reminders to display for this patient.  Lab Results  Component Value Date   TSH 4.390 08/10/2019   Lab Results  Component Value Date   WBC 4.2 08/10/2019   HGB 13.1 08/10/2019   HCT 39.2 08/10/2019   MCV 93 08/10/2019   PLT 286 08/10/2019   Lab Results  Component Value Date   NA 140 08/10/2019   K 4.5 08/10/2019   CO2 23 08/10/2019   GLUCOSE 89 08/10/2019   BUN 17 08/10/2019   CREATININE 0.97 08/10/2019   BILITOT 0.4 08/10/2019   ALKPHOS 68 08/10/2019   AST 18 08/10/2019   ALT 13 08/10/2019   PROT 6.8 08/10/2019   ALBUMIN 4.3 08/10/2019   CALCIUM 9.7 08/10/2019   ANIONGAP 5 09/19/2016   Lab Results  Component Value Date   CHOL 183 08/10/2019   Lab Results  Component Value Date   HDL 40 08/10/2019   Lab Results  Component Value Date   LDLCALC 128 (H) 08/10/2019   Lab Results  Component Value Date   TRIG 81 08/10/2019   Lab Results  Component Value Date   CHOLHDL 4.2 01/03/2018   No results found for: HGBA1C    Assessment & Plan:   Problem List Items Addressed This Visit    1. Dizziness of unknown cause Developed some lightheaded sensation over the past 2 weeks. No known injury, chest pains, palpitations, hearing loss or headaches. Usually happens for only a second or two when he lies down. Most often  occurs each April for a day or two. May use Meclizine prn. Carotids clear without bruits and no rhythm issues. Check routine labs and follow up pending lab reports. - CBC with Differential/Platelet - Comprehensive metabolic panel - TSH  2. Essential (primary) hypertension No orthostatic hypotension or palpitations. No dyspnea, cough or congestion. Continue Amlodipine 5 mg qd. Good control of BP today. Recheck labs. - CBC with Differential/Platelet - Comprehensive metabolic panel - TSH       Follow-up: No follow-ups on file.    Andres Shad, PA, have reviewed all documentation for this visit. The documentation on 12/29/19 for the exam, diagnosis, procedures, and orders are all accurate and complete.

## 2019-12-30 LAB — COMPREHENSIVE METABOLIC PANEL
ALT: 19 IU/L (ref 0–44)
AST: 20 IU/L (ref 0–40)
Albumin/Globulin Ratio: 2 (ref 1.2–2.2)
Albumin: 4.5 g/dL (ref 3.7–4.7)
Alkaline Phosphatase: 66 IU/L (ref 39–117)
BUN/Creatinine Ratio: 20 (ref 10–24)
BUN: 19 mg/dL (ref 8–27)
Bilirubin Total: 0.5 mg/dL (ref 0.0–1.2)
CO2: 25 mmol/L (ref 20–29)
Calcium: 9.5 mg/dL (ref 8.6–10.2)
Chloride: 105 mmol/L (ref 96–106)
Creatinine, Ser: 0.95 mg/dL (ref 0.76–1.27)
GFR calc Af Amer: 91 mL/min/{1.73_m2} (ref >59)
GFR calc non Af Amer: 79 mL/min/{1.73_m2} (ref >59)
Globulin, Total: 2.3 g/dL (ref 1.5–4.5)
Glucose: 85 mg/dL (ref 65–99)
Potassium: 4.2 mmol/L (ref 3.5–5.2)
Sodium: 142 mmol/L (ref 134–144)
Total Protein: 6.8 g/dL (ref 6.0–8.5)

## 2019-12-30 LAB — CBC WITH DIFFERENTIAL/PLATELET
Basophils Absolute: 0 10*3/uL (ref 0.0–0.2)
Basos: 0 %
EOS (ABSOLUTE): 0 10*3/uL (ref 0.0–0.4)
Eos: 1 %
Hematocrit: 38.3 % (ref 37.5–51.0)
Hemoglobin: 12.8 g/dL — ABNORMAL LOW (ref 13.0–17.7)
Immature Grans (Abs): 0 10*3/uL (ref 0.0–0.1)
Immature Granulocytes: 0 %
Lymphocytes Absolute: 0.9 10*3/uL (ref 0.7–3.1)
Lymphs: 24 %
MCH: 31.7 pg (ref 26.6–33.0)
MCHC: 33.4 g/dL (ref 31.5–35.7)
MCV: 95 fL (ref 79–97)
Monocytes Absolute: 0.4 10*3/uL (ref 0.1–0.9)
Monocytes: 11 %
Neutrophils Absolute: 2.4 10*3/uL (ref 1.4–7.0)
Neutrophils: 64 %
Platelets: 257 10*3/uL (ref 150–450)
RBC: 4.04 x10E6/uL — ABNORMAL LOW (ref 4.14–5.80)
RDW: 12.4 % (ref 11.6–15.4)
WBC: 3.8 10*3/uL (ref 3.4–10.8)

## 2019-12-30 LAB — TSH: TSH: 5.44 u[IU]/mL — ABNORMAL HIGH (ref 0.450–4.500)

## 2019-12-31 ENCOUNTER — Other Ambulatory Visit: Payer: Self-pay | Admitting: Family Medicine

## 2019-12-31 ENCOUNTER — Telehealth: Payer: Self-pay

## 2019-12-31 DIAGNOSIS — R42 Dizziness and giddiness: Secondary | ICD-10-CM

## 2019-12-31 MED ORDER — MECLIZINE HCL 12.5 MG PO TABS: 12.5000 mg | ORAL_TABLET | Freq: Three times a day (TID) | ORAL | 0 refills | Status: DC

## 2019-12-31 NOTE — Telephone Encounter (Signed)
Reviewed lab results and physician's note with the patient. Reporting his dizziness is improving. Is requesting a refill for Meclizine 12.5 mg tabs "just in case".  [place] on file. Routing to clinic for consideration.

## 2019-12-31 NOTE — Telephone Encounter (Signed)
LMTCB, [facility] Triage Nurse may give patient results  

## 2019-12-31 NOTE — Addendum Note (Signed)
Addended by: Randal Buba on: 12/31/2019 11:24 AM   Modules accepted: Orders

## 2019-12-31 NOTE — Telephone Encounter (Signed)
-----   Message from Margo Common, [place] sent at 12/31/2019  8:28 AM EDT ----- No sign of bacterial infection. No liver or kidney disease and blood sugar normal. Thyroid shows only minimal variation. Continue present medications. If no better in 4 days, may need referral to a specialist.

## 2020-02-02 NOTE — Progress Notes (Addendum)
Subjective:   Joseph Whitaker is a 74 y.o. male who presents for Medicare Annual/Subsequent preventive examination.  I connected with Otho Najjar today by telephone and verified that I am speaking with the correct person using two identifiers. Location patient: home Location provider: work Persons participating in the virtual visit: patient, provider.   I discussed the limitations, risks, security and privacy concerns of performing an evaluation and management service by telephone and the availability of in person appointments. I also discussed with the patient that there may be a patient responsible charge related to this service. The patient expressed understanding and verbally consented to this telephonic visit.    Interactive audio and video telecommunications were attempted between this provider and patient, however failed, due to patient having technical difficulties OR patient did not have access to video capability.  We continued and completed visit with audio only.   Review of Systems:  N/A  Cardiac Risk Factors include: advanced age (>22men, >89 women);dyslipidemia;male gender;hypertension     Objective:    Vitals: There were no vitals taken for this visit.  There is no height or weight on file to calculate BMI.  Advanced Directives 02/03/2020 01/28/2019 01/03/2018 09/18/2016 09/11/2016 12/19/2015  Does Patient Have a Medical Advance Directive? No No No No No No  Does patient want to make changes to medical advance directive? - No - Patient declined - - - -  Would patient like information on creating a medical advance directive? No - Patient declined - No - Patient declined No - Patient declined No - Patient declined No - patient declined information    Tobacco Social History   Tobacco Use  Smoking Status Former Smoker   Quit date: 09/03/1985   Years since quitting: 34.4  Smokeless Tobacco Never Used     Counseling given: Not Answered   Clinical Intake:  Pre-visit  preparation completed: Yes  Pain : No/denies pain Pain Score: 0-No pain     Nutritional Risks: None Diabetes: No  How often do you need to have someone help you when you read instructions, pamphlets, or other written materials from your doctor or pharmacy?: 1 - Never  Interpreter Needed?: No  Information entered by :: [facility], LPN  Past Medical History:  Diagnosis Date   Arthritis    Hyperlipidemia    Hypertension    Hypothyroidism    Past Surgical History:  Procedure Laterality Date   NO PAST SURGERIES     TOTAL KNEE ARTHROPLASTY Right 09/18/2016   Procedure: Right TOTAL KNEE ARTHROPLASTY;  Surgeon: Paralee Cancel, MD;  Location: [facility];  Service: Orthopedics;  Laterality: Right;  Adductor Block   Family History  Problem Relation Age of Onset   Dementia Father    Hyperlipidemia Sister    Heart attack Brother    Heart disease Maternal Grandmother    Heart disease Maternal Grandfather    Colon cancer Paternal Grandfather    Arthritis Brother    Hyperlipidemia Brother    Hyperlipidemia Brother    Social History   Socioeconomic History   Marital status: Married    Spouse name: Not on file   Number of children: 4   Years of education: Not on file   Highest education level: 12th grade  Occupational History   Occupation: farmer  Tobacco Use   Smoking status: Former Smoker    Quit date: 09/03/1985    Years since quitting: 34.4   Smokeless tobacco: Never Used  Substance and Sexual Activity   Alcohol use:  Not Currently    Alcohol/week: 0.0 - 1.0 standard drinks   Drug use: No   Sexual activity: Not on file  Other Topics Concern   Not on file  Social History Narrative   Not on file   Social Determinants of Health   Financial Resource Strain: Low Risk    Difficulty of Paying Living Expenses: Not hard at all  Food Insecurity: No Food Insecurity   Worried About Charity fundraiser in the Last Year: Never true   [place] in the Last Year: Never true    Transportation Needs: No Transportation Needs   Lack of Transportation (Medical): No   Lack of Transportation (Non-Medical): No  Physical Activity: Inactive   Days of Exercise per Week: 0 days   Minutes of Exercise per Session: 0 min  Stress: No Stress Concern Present   Feeling of Stress : Not at all  Social Connections: Somewhat Isolated   Frequency of Communication with Friends and Family: More than three times a week   Frequency of Social Gatherings with Friends and Family: More than three times a week   Attends Religious Services: Never   Marine scientist or Organizations: No   Attends Archivist Meetings: Never   Marital Status: Married    Outpatient Encounter Medications as of 02/03/2020  Medication Sig   amLODipine (NORVASC) 5 MG tablet TAKE 1 TABLET BY MOUTH EVERY DAY   levothyroxine (SYNTHROID) 25 MCG tablet Take 1 tablet (25 mcg total) by mouth daily.   meclizine (ANTIVERT) 12.5 MG tablet Take 1-2 tablets (12.5-25 mg total) by mouth 3 (three) times daily. (Patient taking differently: Take 12.5-25 mg by mouth 3 (three) times daily as needed. )   Misc Natural Products (CHOLESTEROL SUPPORT PO) Take 1 tablet by mouth 2 (two) times daily. Chlor Support   finasteride (PROSCAR) 5 MG tablet Take 1 tablet (5 mg total) by mouth daily. (Patient not taking: Reported on 02/03/2020)   No facility-administered encounter medications on file as of 02/03/2020.    Activities of Daily Living In your present state of health, do you have any difficulty performing the following activities: 02/03/2020  Hearing? N  Vision? N  Difficulty concentrating or making decisions? N  Walking or climbing stairs? N  Dressing or bathing? N  Doing errands, shopping? N  Preparing Food and eating ? N  Using the Toilet? N  In the past six months, have you accidently leaked urine? Y  Comment Occasionally due to enlarged prostate.  Do you have problems with loss of bowel control? N  Managing your  Medications? N  Managing your Finances? N  Housekeeping or managing your Housekeeping? N  Some recent data might be hidden    Patient Care Team: Chrismon, Vickki Muff, PA as PCP - General (Family Medicine)   Assessment:   This is a routine wellness examination for Mykael.  Exercise Activities and Dietary recommendations Current Exercise Habits: The patient has a physically strenuous job, but has no regular exercise apart from work., Exercise limited by: None identified  Goals      DIET - INCREASE WATER INTAKE     Recommend increasing water intake to 6-8 glasses a day.        Fall Risk: Fall Risk  02/03/2020 01/28/2019 01/03/2018 12/20/2016 12/19/2015  Falls in the past year? 0 0 No No No  Number falls in past yr: 0 - - - -  Injury with Fall? 0 - - - -  FALL RISK PREVENTION PERTAINING TO THE HOME:  Any stairs in or around the home? No  If so, are there any without handrails?  N/A  Home free of loose throw rugs in walkways, pet beds, electrical cords, etc? Yes  Adequate lighting in your home to reduce risk of falls? Yes   ASSISTIVE DEVICES UTILIZED TO PREVENT FALLS:  Life alert? No  Use of a cane, walker or w/c? No  Grab bars in the bathroom? No  Shower chair or bench in shower? No  Elevated toilet seat or a handicapped toilet? No   TIMED UP AND GO:  Was the test performed? No .    Depression Screen PHQ 2/9 Scores 02/03/2020 01/28/2019 01/03/2018 12/20/2016  PHQ - 2 Score 0 0 0 0  PHQ- 9 Score - - - 0    Cognitive Function: Declined today.        Immunization History  Administered Date(s) Administered   Fluad Quad(high Dose 65+) 08/10/2019   Influenza Split 08/11/2009, 08/14/2010, 08/17/2011   Influenza, High Dose Seasonal PF 08/06/2016, 09/09/2017   Tdap 04/04/2009   Zoster 07/02/2008    Qualifies for Shingles Vaccine? Yes  Zostavax completed 07/02/08. Due for Shingrix. Pt has been advised to call insurance company to determine out of pocket expense. Advised  may also receive vaccine at local pharmacy or Health Dept. Verbalized acceptance and understanding.  Tdap: Although this vaccine is not a covered service during a Wellness Exam, does the patient still wish to receive this vaccine today?  No . Advised may receive this vaccine at local pharmacy or Health Dept. Aware to provide a copy of the vaccination record if obtained from local pharmacy or Health Dept. Verbalized acceptance and understanding.  Flu Vaccine: Up to date  Pneumococcal Vaccine: Due for Pneumococcal vaccine. Does the patient want to receive this vaccine today?  No . Advised may receive this vaccine at local pharmacy or Health Dept. Aware to provide a copy of the vaccination record if obtained from local pharmacy or Health Dept. Verbalized acceptance and understanding.   Screening Tests Health Maintenance  Topic Date Due   COVID-19 Vaccine (1) Never done   TETANUS/TDAP  02/02/2021 (Originally 04/05/2019)   PNA vac Low Risk Adult (1 of 2 - PCV13) 02/02/2021 (Originally 12/22/2011)   INFLUENZA VACCINE  04/03/2020   COLONOSCOPY  11/21/2020   Hepatitis C Screening  Completed   Cancer Screenings:  Colorectal Screening: Completed 11/22/10. Repeat every 10 years.   Lung Cancer Screening: (Low Dose CT Chest recommended if Age 38-80 years, 30 pack-year currently smoking OR have quit w/in 15years.) does not qualify.   Additional Screening:  Hepatitis C Screening: Up to date   Vision Screening: Recommended annual ophthalmology exams for early detection of glaucoma and other disorders of the eye.  Dental Screening: Recommended annual dental exams for proper oral hygiene  Community Resource Referral:  CRR required this visit? No      Plan:  I have personally reviewed and addressed the Medicare Annual Wellness questionnaire and have noted the following in the patients chart:  A. Medical and social history B. Use of alcohol, tobacco or illicit drugs  C. Current medications and  supplements D. Functional ability and status E.  Nutritional status F.  Physical activity G. Advance directives H. List of other physicians I.  Hospitalizations, surgeries, and ER visits in previous 12 months J.  [place] such as hearing and vision if needed, cognitive and depression L. Referrals and appointments  In addition, I have reviewed and discussed with patient certain preventive protocols, quality metrics, and best practice recommendations. A written personalized care plan for preventive services as well as general preventive health recommendations were provided to patient.   [place]ora Score, [place]  [id number] Nurse Health Advisor   Nurse Notes: Pt declined receiving a future pneumonia vaccine.   Reviewed Nurse Health Advisor screening note. Agree with documentation and recommendations.

## 2020-02-03 ENCOUNTER — Other Ambulatory Visit: Payer: Self-pay

## 2020-02-03 ENCOUNTER — Ambulatory Visit (INDEPENDENT_AMBULATORY_CARE_PROVIDER_SITE_OTHER): Payer: Medicare Other

## 2020-02-03 DIAGNOSIS — Z0000 Encounter for general adult medical examination without abnormal findings: Secondary | ICD-10-CM

## 2020-02-03 NOTE — Patient Instructions (Signed)
Joseph Whitaker , Thank you for taking time to come for your Medicare Wellness Visit. I appreciate your ongoing commitment to your health goals. Please review the following plan we discussed and let me know if I can assist you in the future.   Screening recommendations/referrals: Colonoscopy: Up to date, due 11/2020 Recommended yearly ophthalmology/optometry visit for glaucoma screening and checkup Recommended yearly dental visit for hygiene and checkup  Vaccinations: Influenza vaccine: Up to date Pneumococcal vaccine: Currently due. Declined a future order. Tdap vaccine: Pt declines today.  Shingles vaccine: Pt declines today.     Advanced directives: Advance directive discussed with you today. Even though you declined this today please call our office should you change your mind and we can give you the proper paperwork for you to fill out.  Conditions/risks identified: Recommend increasing water intake to 6-8 8 oz glasses a day.  Next appointment: None, declined scheduling a follow up with PCP or an AWV for 2022 at this time.   Preventive Care 73 Years and Older, Male Preventive care refers to lifestyle choices and visits with your health care provider that can promote health and wellness. What does preventive care include?  A yearly physical exam. This is also called an annual well check.  Dental exams once or twice a year.  Routine eye exams. Ask your health care provider how often you should have your eyes checked.  Personal lifestyle choices, including:  Daily care of your teeth and gums.  Regular physical activity.  Eating a healthy diet.  Avoiding tobacco and drug use.  Limiting alcohol use.  Practicing safe sex.  Taking low doses of aspirin every day.  Taking vitamin and mineral supplements as recommended by your health care provider. What happens during an annual well check? The services and screenings done by your health care provider during your annual well check  will depend on your age, overall health, lifestyle risk factors, and family history of disease. Counseling  Your health care provider may ask you questions about your:  Alcohol use.  Tobacco use.  Drug use.  Emotional well-being.  Home and relationship well-being.  Sexual activity.  Eating habits.  History of falls.  Memory and ability to understand (cognition).  Work and work Statistician. Screening  You may have the following tests or measurements:  Height, weight, and BMI.  Blood pressure.  Lipid and cholesterol levels. These may be checked every 5 years, or more frequently if you are over 9 years old.  Skin check.  Lung cancer screening. You may have this screening every year starting at age 17 if you have a 30-pack-year history of smoking and currently smoke or have quit within the past 15 years.  Fecal occult blood test (FOBT) of the stool. You may have this test every year starting at age 8.  Flexible sigmoidoscopy or colonoscopy. You may have a sigmoidoscopy every 5 years or a colonoscopy every 10 years starting at age 27.  Prostate cancer screening. Recommendations will vary depending on your family history and other risks.  Hepatitis C blood test.  Hepatitis B blood test.  Sexually transmitted disease (STD) testing.  Diabetes screening. This is done by checking your blood sugar (glucose) after you have not eaten for a while (fasting). You may have this done every 1-3 years.  Abdominal aortic aneurysm (AAA) screening. You may need this if you are a current or former smoker.  Osteoporosis. You may be screened starting at age 44 if you are at high risk.  Talk with your health care provider about your test results, treatment options, and if necessary, the need for more tests. Vaccines  Your health care provider may recommend certain vaccines, such as:  Influenza vaccine. This is recommended every year.  Tetanus, diphtheria, and acellular pertussis  (Tdap, Td) vaccine. You may need a Td booster every 10 years.  Zoster vaccine. You may need this after age 21.  Pneumococcal 13-valent conjugate (PCV13) vaccine. One dose is recommended after age 67.  Pneumococcal polysaccharide (PPSV23) vaccine. One dose is recommended after age 2. Talk to your health care provider about which screenings and vaccines you need and how often you need them. This information is not intended to replace advice given to you by your health care provider. Make sure you discuss any questions you have with your health care provider. Document Released: 09/16/2015 Document Revised: 05/09/2016 Document Reviewed: 06/21/2015 Elsevier Interactive Patient Education  2017 [place] Prevention in the Home Falls can cause injuries. They can happen to people of all ages. There are many things you can do to make your home safe and to help prevent falls. What can I do on the outside of my home?  Regularly fix the edges of walkways and driveways and fix any cracks.  Remove anything that might make you trip as you walk through a door, such as a raised step or threshold.  Trim any bushes or trees on the path to your home.  Use bright outdoor lighting.  Clear any walking paths of anything that might make someone trip, such as rocks or tools.  Regularly check to see if handrails are loose or broken. Make sure that both sides of any steps have handrails.  Any raised decks and porches should have guardrails on the edges.  Have any leaves, snow, or ice cleared regularly.  Use sand or salt on walking paths during winter.  Clean up any spills in your garage right away. This includes oil or grease spills. What can I do in the bathroom?  Use night lights.  Install grab bars by the toilet and in the tub and shower. Do not use towel bars as grab bars.  Use non-skid mats or decals in the tub or shower.  If you need to sit down in the shower, use a plastic, non-slip  stool.  Keep the floor dry. Clean up any water that spills on the floor as soon as it happens.  Remove soap buildup in the tub or shower regularly.  Attach bath mats securely with double-sided non-slip rug tape.  Do not have throw rugs and other things on the floor that can make you trip. What can I do in the bedroom?  Use night lights.  Make sure that you have a light by your bed that is easy to reach.  Do not use any sheets or blankets that are too big for your bed. They should not hang down onto the floor.  Have a firm chair that has side arms. You can use this for support while you get dressed.  Do not have throw rugs and other things on the floor that can make you trip. What can I do in the kitchen?  Clean up any spills right away.  Avoid walking on wet floors.  Keep items that you use a lot in easy-to-reach places.  If you need to reach something above you, use a strong step stool that has a grab bar.  Keep electrical cords out of the way.  Do not use floor polish or wax that makes floors slippery. If you must use wax, use non-skid floor wax.  Do not have throw rugs and other things on the floor that can make you trip. What can I do with my stairs?  Do not leave any items on the stairs.  Make sure that there are handrails on both sides of the stairs and use them. Fix handrails that are broken or loose. Make sure that handrails are as long as the stairways.  Check any carpeting to make sure that it is firmly attached to the stairs. Fix any carpet that is loose or worn.  Avoid having throw rugs at the top or bottom of the stairs. If you do have throw rugs, attach them to the floor with carpet tape.  Make sure that you have a light switch at the top of the stairs and the bottom of the stairs. If you do not have them, ask someone to add them for you. What else can I do to help prevent falls?  Wear shoes that:  Do not have high heels.  Have rubber bottoms.  Are  comfortable and fit you well.  Are closed at the toe. Do not wear sandals.  If you use a stepladder:  Make sure that it is fully opened. Do not climb a closed stepladder.  Make sure that both sides of the stepladder are locked into place.  Ask someone to hold it for you, if possible.  Clearly mark and make sure that you can see:  Any grab bars or handrails.  First and last steps.  Where the edge of each step is.  Use tools that help you move around (mobility aids) if they are needed. These include:  Canes.  Walkers.  Scooters.  Crutches.  Turn on the lights when you go into a dark area. Replace any light bulbs as soon as they burn out.  Set up your furniture so you have a clear path. Avoid moving your furniture around.  If any of your floors are uneven, fix them.  If there are any pets around you, be aware of where they are.  Review your medicines with your doctor. Some medicines can make you feel dizzy. This can increase your chance of falling. Ask your doctor what other things that you can do to help prevent falls. This information is not intended to replace advice given to you by your health care provider. Make sure you discuss any questions you have with your health care provider. Document Released: 06/16/2009 Document Revised: 01/26/2016 Document Reviewed: 09/24/2014 Elsevier Interactive Patient Education  2017 [employer].

## 2020-03-28 ENCOUNTER — Other Ambulatory Visit: Payer: Self-pay | Admitting: Family Medicine

## 2020-03-28 NOTE — Telephone Encounter (Signed)
Requested medication (s) are due for refill today:  Yes  Requested medication (s) are on the active medication list:  Yes  Future visit scheduled:  No  Last Refill: 09/29/19; #90; RF x 1  Note to Clinic:  last TSH abnormal in 12/2019; failed protocol  Requested Prescriptions  Pending Prescriptions Disp Refills   levothyroxine (SYNTHROID) 25 MCG tablet [Pharmacy Med Name: LEVOTHYROXINE 0.025MG  (25MCG) TAB] 90 tablet 1    Sig: TAKE 1 TABLET(25 MCG) BY MOUTH DAILY      Endocrinology:  Hypothyroid Agents Failed - 03/28/2020  4:21 PM      Failed - TSH needs to be rechecked within 3 months after an abnormal result. Refill until TSH is due.      Failed - TSH in normal range and within 360 days    TSH  Date Value Ref Range Status  12/29/2019 5.440 (H) 0.450 - 4.500 uIU/mL Final          Passed - Valid encounter within last 12 months    Recent Outpatient Visits           3 months ago Dizziness of unknown cause   [place], [place]   1 year ago [place], Vickki Muff, [place]   1 year ago [facility], Donald E, [place]   2 years ago Annual physical exam   [place], Vickki Muff, [place]   3 years ago Pure hypercholesterolemia   [facility], Vickki Muff, [place]

## 2020-06-14 DIAGNOSIS — R509 Fever, unspecified: Secondary | ICD-10-CM | POA: Diagnosis not present

## 2020-06-14 DIAGNOSIS — R6889 Other general symptoms and signs: Secondary | ICD-10-CM | POA: Diagnosis not present

## 2020-06-14 DIAGNOSIS — Z03818 Encounter for observation for suspected exposure to other biological agents ruled out: Secondary | ICD-10-CM | POA: Diagnosis not present

## 2020-06-14 DIAGNOSIS — R350 Frequency of micturition: Secondary | ICD-10-CM | POA: Diagnosis not present

## 2020-06-18 DIAGNOSIS — L509 Urticaria, unspecified: Secondary | ICD-10-CM | POA: Diagnosis not present

## 2020-06-18 DIAGNOSIS — Z03818 Encounter for observation for suspected exposure to other biological agents ruled out: Secondary | ICD-10-CM | POA: Diagnosis not present

## 2020-07-14 DIAGNOSIS — Z20828 Contact with and (suspected) exposure to other viral communicable diseases: Secondary | ICD-10-CM | POA: Diagnosis not present

## 2020-08-03 DIAGNOSIS — J209 Acute bronchitis, unspecified: Secondary | ICD-10-CM | POA: Diagnosis not present

## 2020-08-03 DIAGNOSIS — B9689 Other specified bacterial agents as the cause of diseases classified elsewhere: Secondary | ICD-10-CM | POA: Diagnosis not present

## 2020-08-03 DIAGNOSIS — J019 Acute sinusitis, unspecified: Secondary | ICD-10-CM | POA: Diagnosis not present

## 2020-08-10 DIAGNOSIS — J019 Acute sinusitis, unspecified: Secondary | ICD-10-CM | POA: Diagnosis not present

## 2020-08-10 DIAGNOSIS — B9689 Other specified bacterial agents as the cause of diseases classified elsewhere: Secondary | ICD-10-CM | POA: Diagnosis not present

## 2020-08-10 DIAGNOSIS — R059 Cough, unspecified: Secondary | ICD-10-CM | POA: Diagnosis not present

## 2020-09-20 ENCOUNTER — Other Ambulatory Visit: Payer: Self-pay | Admitting: Family Medicine

## 2020-09-20 NOTE — Telephone Encounter (Signed)
Requested medication (s) are due for refill today:   Yes  Requested medication (s) are on the active medication list:   Yes  Future visit scheduled:   No   Last ordered: 03/29/2020 #90, 1 refill  Returned because failed protocol due to labs being due.   Requested Prescriptions  Pending Prescriptions Disp Refills   levothyroxine (SYNTHROID) 25 MCG tablet [Pharmacy Med Name: LEVOTHYROXINE 0.025MG  (25MCG) TAB] 90 tablet 1    Sig: TAKE 1 TABLET(25 MCG) BY MOUTH DAILY      Endocrinology:  Hypothyroid Agents Failed - 09/20/2020 10:49 AM      Failed - TSH needs to be rechecked within 3 months after an abnormal result. Refill until TSH is due.      Failed - TSH in normal range and within 360 days    TSH  Date Value Ref Range Status  12/29/2019 5.440 (H) 0.450 - 4.500 uIU/mL Final          Passed - Valid encounter within last 12 months    Recent Outpatient Visits           8 months ago Dizziness of unknown cause   [place], PA-C   1 year ago [place], Vickki Muff, PA-C   2 years ago [place], Donald E, MD   2 years ago Annual physical exam   [place], PA-C   3 years ago Pure hypercholesterolemia   [facility], Vickki Muff, [place]

## 2020-09-26 ENCOUNTER — Other Ambulatory Visit: Payer: Self-pay

## 2020-09-26 ENCOUNTER — Encounter: Payer: Self-pay | Admitting: Family Medicine

## 2020-09-26 ENCOUNTER — Ambulatory Visit (INDEPENDENT_AMBULATORY_CARE_PROVIDER_SITE_OTHER): Payer: Medicare Other | Admitting: Family Medicine

## 2020-09-26 VITALS — BP 140/80 | HR 69 | Temp 98.8°F | Resp 16 | Ht 69.0 in | Wt 174.0 lb

## 2020-09-26 DIAGNOSIS — E7800 Pure hypercholesterolemia, unspecified: Secondary | ICD-10-CM | POA: Diagnosis not present

## 2020-09-26 DIAGNOSIS — E039 Hypothyroidism, unspecified: Secondary | ICD-10-CM | POA: Diagnosis not present

## 2020-09-26 DIAGNOSIS — N400 Enlarged prostate without lower urinary tract symptoms: Secondary | ICD-10-CM

## 2020-09-26 DIAGNOSIS — I10 Essential (primary) hypertension: Secondary | ICD-10-CM | POA: Diagnosis not present

## 2020-09-26 DIAGNOSIS — Z2821 Immunization not carried out because of patient refusal: Secondary | ICD-10-CM | POA: Diagnosis not present

## 2020-09-26 NOTE — Progress Notes (Signed)
Established patient visit   Patient: Joseph Whitaker   DOB: [birth year]   75 y.o. Male  MRN: [id number] Visit Date: 09/26/2020  Today's healthcare provider: Vernie Murders, PA-C   Chief Complaint  Patient presents with  . Hypertension  . Hypothyroidism  . Hyperlipidemia   Subjective    HPI  Had AWV with [facility] on 02-03-20.  Hypertension, follow-up  BP Readings from Last 3 Encounters:  12/29/19 118/72  08/10/19 122/70  10/27/18 110/60   Wt Readings from Last 3 Encounters:  12/29/19 170 lb (77.1 kg)  08/10/19 176 lb (79.8 kg)  10/27/18 181 lb (82.1 kg)     He was last seen for hypertension 9 months ago.  BP at that visit was 118/72. Management since that visit includes continuing same medication.  He reports good compliance with treatment. He is not having side effects.  He is following a Regular diet. He is exercising. He does not smoke.  Use of agents associated with hypertension: thyroid hormones.   Outside blood pressures are 118/78. Symptoms: No chest pain No chest pressure  No palpitations No syncope  No dyspnea No orthopnea  No paroxysmal nocturnal dyspnea No lower extremity edema   Pertinent labs: Lab Results  Component Value Date   CHOL 183 08/10/2019   HDL 40 08/10/2019   LDLCALC 128 (H) 08/10/2019   TRIG 81 08/10/2019   CHOLHDL 4.2 01/03/2018   Lab Results  Component Value Date   NA 142 12/29/2019   K 4.2 12/29/2019   CREATININE 0.95 12/29/2019   GFRNONAA 79 12/29/2019   GFRAA 91 12/29/2019   GLUCOSE 85 12/29/2019     The 10-year ASCVD risk score Mikey Bussing DC Jr., et al., 2013) is: 34.4%   ---------------------------------------------------------------------------------------------------  Lipid/Cholesterol, Follow-up  Last lipid panel Other pertinent labs  Lab Results  Component Value Date   CHOL 183 08/10/2019   HDL 40 08/10/2019   LDLCALC 128 (H) 08/10/2019   TRIG 81 08/10/2019   CHOLHDL 4.2 01/03/2018   Lab Results  Component  Value Date   ALT 19 12/29/2019   AST 20 12/29/2019   PLT 257 12/29/2019   TSH 5.440 (H) 12/29/2019     He was last seen for this 1 year ago.  Management since that visit includes advising patient to work on low fat diet and add Red Yeast Rice with Co-Q 10 tablet daily.  He reports fair compliance with treatment. Patient has been watching his diet, but has not bee taking any red yeast rice. He does take an OTC cholesterol support tablet.  He is not having side effects.   Symptoms: No chest pain No chest pressure/discomfort  No dyspnea No lower extremity edema  No numbness or tingling of extremity No orthopnea  No palpitations No paroxysmal nocturnal dyspnea  No speech difficulty No syncope   Current diet: low fat/ cholesterol Current exercise: gardening  The 10-year ASCVD risk score Mikey Bussing DC Jr., et al., 2013) is: 34.4%  ---------------------------------------------------------------------------------------------------  Hypothyroid, follow-up  Lab Results  Component Value Date   TSH 5.440 (H) 12/29/2019   TSH 4.390 08/10/2019   TSH 3.180 10/27/2018   T4TOTAL 6.2 08/10/2019   T4TOTAL 6.8 12/20/2016   Wt Readings from Last 3 Encounters:  12/29/19 170 lb (77.1 kg)  08/10/19 176 lb (79.8 kg)  10/27/18 181 lb (82.1 kg)    He was last seen for hypothyroid 1 year ago.  Management since that visit includes continuing same medication. He reports good compliance  with treatment. He is not having side effects.   Symptoms: No change in energy level No constipation  No diarrhea No heat / cold intolerance  No nervousness No palpitations  No weight changes    -----------------------------------------------------------------------------------------  Past Medical History:  Diagnosis Date  . Arthritis   . Hyperlipidemia   . Hypertension   . Hypothyroidism    Past Surgical History:  Procedure Laterality Date  . NO PAST SURGERIES    . TOTAL KNEE ARTHROPLASTY Right  09/18/2016   Procedure: Right TOTAL KNEE ARTHROPLASTY;  Surgeon: Paralee Cancel, MD;  Location: [facility];  Service: Orthopedics;  Laterality: Right;  Adductor Block   Social History   Tobacco Use  . Smoking status: Former Smoker    Quit date: 09/03/1985    Years since quitting: 35.0  . Smokeless tobacco: Never Used  Vaping Use  . Vaping Use: Never used  Substance Use Topics  . Alcohol use: Not Currently    Alcohol/week: 0.0 - 1.0 standard drinks  . Drug use: No   Family History  Problem Relation Age of Onset  . Dementia Father   . Hyperlipidemia Sister   . Heart attack Brother   . Heart disease Maternal Grandmother   . Heart disease Maternal Grandfather   . Colon cancer Paternal Grandfather   . Arthritis Brother   . Hyperlipidemia Brother   . Hyperlipidemia Brother    Allergies  Allergen Reactions  . Red Yeast Rice [Cholestin] Other (See Comments)    Dizziness  . Statins Other (See Comments)    Weight loss, malaise.       Medications: Outpatient Medications Prior to Visit  Medication Sig  . amLODipine (NORVASC) 5 MG tablet TAKE 1 TABLET BY MOUTH EVERY DAY  . finasteride (PROSCAR) 5 MG tablet Take 1 tablet (5 mg total) by mouth daily. (Patient not taking: Reported on 02/03/2020)  . levothyroxine (SYNTHROID) 25 MCG tablet TAKE 1 TABLET(25 MCG) BY MOUTH DAILY  . meclizine (ANTIVERT) 12.5 MG tablet Take 1-2 tablets (12.5-25 mg total) by mouth 3 (three) times daily. (Patient taking differently: Take 12.5-25 mg by mouth 3 (three) times daily as needed. )  . Misc Natural Products (CHOLESTEROL SUPPORT PO) Take 1 tablet by mouth 2 (two) times daily. Chlor Support   No facility-administered medications prior to visit.    Review of Systems  Constitutional: Negative for appetite change, chills and fever.  Respiratory: Negative for chest tightness, shortness of breath and wheezing.   Cardiovascular: Negative for chest pain and palpitations.  Gastrointestinal: Negative for abdominal  pain, nausea and vomiting.  Genitourinary: Positive for frequency.       Objective    BP 140/80 (BP Location: Right Arm, Patient Position: Sitting, Cuff Size: Large)   Pulse 69   Temp 98.8 F (37.1 C) (Temporal)   Resp 16   Ht 5\' 9"  (1.753 m)   Wt 174 lb (78.9 kg)   BMI 25.70 kg/m  BP Readings from Last 3 Encounters:  09/26/20 140/80  12/29/19 118/72  08/10/19 122/70   Wt Readings from Last 3 Encounters:  09/26/20 174 lb (78.9 kg)  12/29/19 170 lb (77.1 kg)  08/10/19 176 lb (79.8 kg)   Physical Exam Constitutional:      Appearance: He is well-developed.  HENT:     Head: Normocephalic and atraumatic.     Right Ear: External ear normal.     Left Ear: External ear normal.     Nose: Nose normal.  Eyes:  General:        Right eye: No discharge.     Conjunctiva/sclera: Conjunctivae normal.     Pupils: Pupils are equal, round, and reactive to light.  Neck:     Thyroid: No thyromegaly.     Trachea: No tracheal deviation.  Cardiovascular:     Rate and Rhythm: Normal rate and regular rhythm.     Heart sounds: Normal heart sounds. No murmur heard.   Pulmonary:     Effort: Pulmonary effort is normal. No respiratory distress.     Breath sounds: Normal breath sounds. No wheezing or rales.  Chest:     Chest wall: No tenderness.  Abdominal:     General: There is no distension.     Palpations: Abdomen is soft. There is no mass.     Tenderness: There is no abdominal tenderness. There is no guarding or rebound.  Musculoskeletal:        General: No tenderness. Normal range of motion.     Cervical back: Normal range of motion and neck supple.  Lymphadenopathy:     Cervical: No cervical adenopathy.  Skin:    General: Skin is warm and dry.     Findings: No erythema or rash.  Neurological:     Mental Status: He is alert and oriented to person, place, and time.     Cranial Nerves: No cranial nerve deficit.     Motor: No abnormal muscle tone.     Coordination:  Coordination normal.     Deep Tendon Reflexes: Reflexes are normal and symmetric. Reflexes normal.  Psychiatric:        Behavior: Behavior normal.        Thought Content: Thought content normal.        Judgment: Judgment normal.      No results found for any visits on 09/26/20.  Assessment & Plan     1. Enlarged prostate on rectal examination Using an over the counter prostate supplement and get up only 1-2 times a night to urinate. DRE with no specific nodules identified. Negative Hemoccult tests of stools. Recheck labs and follow up prn.  - CBC with Differential/Platelet - Comprehensive metabolic panel - PSA  2. Essential (primary) hypertension Fair control with Amlodipine 5 mg qd and following lifestyle changes/DASH diet. Recheck labs. - CBC with Differential/Platelet - Comprehensive metabolic panel - Lipid panel - TSH  3. Elevated LDL cholesterol level Tolerating Natural Cholesterol Support BID and trying to follow a low fat diet. Recheck labs. - CBC with Differential/Platelet - Comprehensive metabolic panel - Lipid panel - TSH  4. Acquired hypothyroidism No constipation, hair loss, tremor, edema or brittle nails. Tolerating Synthroid 25 mcg qd without side effects. Recheck labs. - CBC with Differential/Platelet - Comprehensive metabolic panel - TSH - T4  5. COVID immunization declined  No follow-ups on file.      I, Olean Sangster, PA-C, have reviewed all documentation for this visit. The documentation on 09/26/20 for the exam, diagnosis, procedures, and orders are all accurate and complete.    Vernie Murders, PA-C  [employer] [phone] (phone) [phone] (fax)  [place]

## 2020-09-27 ENCOUNTER — Ambulatory Visit: Payer: Self-pay

## 2020-09-27 ENCOUNTER — Telehealth: Payer: Self-pay

## 2020-09-27 LAB — CBC WITH DIFFERENTIAL/PLATELET
Basophils Absolute: 0 10*3/uL (ref 0.0–0.2)
Basos: 0 %
EOS (ABSOLUTE): 0 10*3/uL (ref 0.0–0.4)
Eos: 1 %
Hematocrit: 37.7 % (ref 37.5–51.0)
Hemoglobin: 12.7 g/dL — ABNORMAL LOW (ref 13.0–17.7)
Immature Grans (Abs): 0 10*3/uL (ref 0.0–0.1)
Immature Granulocytes: 0 %
Lymphocytes Absolute: 1.1 10*3/uL (ref 0.7–3.1)
Lymphs: 25 %
MCH: 30.3 pg (ref 26.6–33.0)
MCHC: 33.7 g/dL (ref 31.5–35.7)
MCV: 90 fL (ref 79–97)
Monocytes Absolute: 0.4 10*3/uL (ref 0.1–0.9)
Monocytes: 10 %
Neutrophils Absolute: 2.7 10*3/uL (ref 1.4–7.0)
Neutrophils: 64 %
Platelets: 265 10*3/uL (ref 150–450)
RBC: 4.19 x10E6/uL (ref 4.14–5.80)
RDW: 13.2 % (ref 11.6–15.4)
WBC: 4.3 10*3/uL (ref 3.4–10.8)

## 2020-09-27 LAB — COMPREHENSIVE METABOLIC PANEL
ALT: 13 IU/L (ref 0–44)
AST: 14 IU/L (ref 0–40)
Albumin/Globulin Ratio: 1.7 (ref 1.2–2.2)
Albumin: 4.4 g/dL (ref 3.7–4.7)
Alkaline Phosphatase: 64 IU/L (ref 44–121)
BUN/Creatinine Ratio: 14 (ref 10–24)
BUN: 14 mg/dL (ref 8–27)
Bilirubin Total: 0.3 mg/dL (ref 0.0–1.2)
CO2: 24 mmol/L (ref 20–29)
Calcium: 9.7 mg/dL (ref 8.6–10.2)
Chloride: 105 mmol/L (ref 96–106)
Creatinine, Ser: 1.02 mg/dL (ref 0.76–1.27)
GFR calc Af Amer: 84 mL/min/{1.73_m2} (ref >59)
GFR calc non Af Amer: 73 mL/min/{1.73_m2} (ref >59)
Globulin, Total: 2.6 g/dL (ref 1.5–4.5)
Glucose: 95 mg/dL (ref 65–99)
Potassium: 4.4 mmol/L (ref 3.5–5.2)
Sodium: 140 mmol/L (ref 134–144)
Total Protein: 7 g/dL (ref 6.0–8.5)

## 2020-09-27 LAB — LIPID PANEL
Chol/HDL Ratio: 5.5 ratio — ABNORMAL HIGH (ref 0.0–5.0)
Cholesterol, Total: 205 mg/dL — ABNORMAL HIGH (ref 100–199)
HDL: 37 mg/dL — ABNORMAL LOW
LDL Chol Calc (NIH): 151 mg/dL — ABNORMAL HIGH (ref 0–99)
Triglycerides: 91 mg/dL (ref 0–149)
VLDL Cholesterol Cal: 17 mg/dL (ref 5–40)

## 2020-09-27 LAB — TSH: TSH: 6.3 u[IU]/mL — ABNORMAL HIGH (ref 0.450–4.500)

## 2020-09-27 LAB — T4: T4, Total: 6.1 ug/dL (ref 4.5–12.0)

## 2020-09-27 LAB — PSA: Prostate Specific Ag, Serum: 1.3 ng/mL (ref 0.0–4.0)

## 2020-09-27 NOTE — Telephone Encounter (Signed)
Copied from [place] [id number]. Topic: Referral - Request for Referral >> Sep 27, 2020  1:09 PM Erick Blinks wrote: Has patient seen PCP for this complaint? Yes.   *If NO, is insurance requiring patient see PCP for this issue before PCP can refer them? Referral for which specialty: Head/Neck Preferred provider/office: Highest recommended/within network Reason for referral: Veins in head/neck - pt states he has been experiencing numbness in his right arm (transferred to triage)

## 2020-09-27 NOTE — Telephone Encounter (Signed)
Patient called and says that for the past month or longer when he wakes up he has numbness to this right hand. He says it doesn't last long and he doesn't have numbness now. He says he just forgot to mention it yesterday at his physical with Simona Huh. He denies any symptoms. Care advice given, patient verbalized understanding.  Reason for Disposition . [1] Tingling in hand (e.g., pins and needles) AND [2] after prolonged laying on arm AND [3]  brief (now gone)  Answer Assessment - Initial Assessment Questions 1. SYMPTOM: "What is the main symptom you are concerned about?" (e.g., weakness, numbness)     Numbness right hand 2. ONSET: "When did this start?" (minutes, hours, days; while sleeping)     Over a month ago 3. LAST NORMAL: "When was the last time you were normal (no symptoms)?"     Over a month ago 4. PATTERN "Does this come and go, or has it been constant since it started?"  "Is it present now?"     Comes and goes, mostly at night when wake up hands numb 5. CARDIAC SYMPTOMS: "Have you had any of the following symptoms: chest pain, difficulty breathing, palpitations?"     No 6. NEUROLOGIC SYMPTOMS: "Have you had any of the following symptoms: headache, dizziness, vision loss, double vision, changes in speech, unsteady on your feet?"     No 7. OTHER SYMPTOMS: "Do you have any other symptoms?"     No 8. PREGNANCY: "Is there any chance you are pregnant?" "When was your last menstrual period?"     No  Protocols used: NEUROLOGIC DEFICIT-A-AH

## 2020-10-03 ENCOUNTER — Other Ambulatory Visit: Payer: Self-pay

## 2020-10-03 ENCOUNTER — Telehealth: Payer: Self-pay | Admitting: Family Medicine

## 2020-10-03 MED ORDER — LEVOTHYROXINE SODIUM 50 MCG PO TABS: 50.0000 ug | ORAL_TABLET | Freq: Every day | ORAL | 3 refills | Status: DC

## 2020-10-03 NOTE — Telephone Encounter (Signed)
Patient is calling for his lab results Please advise CB- [phone]

## 2020-10-04 ENCOUNTER — Other Ambulatory Visit: Payer: Self-pay | Admitting: Family Medicine

## 2020-10-06 DIAGNOSIS — R202 Paresthesia of skin: Secondary | ICD-10-CM | POA: Diagnosis not present

## 2020-10-06 DIAGNOSIS — D2262 Melanocytic nevi of left upper limb, including shoulder: Secondary | ICD-10-CM | POA: Diagnosis not present

## 2020-10-06 DIAGNOSIS — D2271 Melanocytic nevi of right lower limb, including hip: Secondary | ICD-10-CM | POA: Diagnosis not present

## 2020-10-06 DIAGNOSIS — L298 Other pruritus: Secondary | ICD-10-CM | POA: Diagnosis not present

## 2020-10-06 DIAGNOSIS — Z85820 Personal history of malignant melanoma of skin: Secondary | ICD-10-CM | POA: Diagnosis not present

## 2020-10-06 DIAGNOSIS — L570 Actinic keratosis: Secondary | ICD-10-CM | POA: Diagnosis not present

## 2020-10-06 DIAGNOSIS — D485 Neoplasm of uncertain behavior of skin: Secondary | ICD-10-CM | POA: Diagnosis not present

## 2020-10-06 DIAGNOSIS — C44319 Basal cell carcinoma of skin of other parts of face: Secondary | ICD-10-CM | POA: Diagnosis not present

## 2020-10-06 DIAGNOSIS — Z85828 Personal history of other malignant neoplasm of skin: Secondary | ICD-10-CM | POA: Diagnosis not present

## 2020-10-06 DIAGNOSIS — X32XXXA Exposure to sunlight, initial encounter: Secondary | ICD-10-CM | POA: Diagnosis not present

## 2020-11-01 DIAGNOSIS — C44319 Basal cell carcinoma of skin of other parts of face: Secondary | ICD-10-CM | POA: Diagnosis not present

## 2020-11-04 ENCOUNTER — Telehealth: Payer: Self-pay

## 2020-11-04 NOTE — Telephone Encounter (Signed)
Unable to contact pt. Called patient's home and spoke to his wife (she is on [place]) to advise that we do not do this type of testing here at the office. He would need to be seen to discuss and possibly be referred to a specialist. Also advised that Simona Huh is not in the office and that he could discuss with another provider if he wanted to have this done sooner.

## 2020-11-04 NOTE — Telephone Encounter (Signed)
Copied from [place] [id number]. Topic: Appointment Scheduling - Scheduling Inquiry for Clinic >> Nov 04, 2020 12:56 PM Oneta Rack wrote: Reason for CRM:   patient requesting to speak with a nurse regarding veins located in neck and heart. Patient would like a test ran to ensure blood flow. Patient declined appointment and wanted to know if PCP can conduct in office or does he have to be referred out. Please follow up with patient directly >> Nov 04, 2020  1:19 PM Oneta Rack wrote: Also patient stated he mentioned this 3 weeks ago at appointment and has not heard back.

## 2020-11-08 DIAGNOSIS — C44319 Basal cell carcinoma of skin of other parts of face: Secondary | ICD-10-CM | POA: Diagnosis not present

## 2021-01-10 ENCOUNTER — Other Ambulatory Visit: Payer: Self-pay | Admitting: Family Medicine

## 2021-01-10 NOTE — Telephone Encounter (Signed)
Requested medication (s) are due for refill today: yes  Requested medication (s) are on the active medication list: yes  Last refill:  10/04/2020  Future visit scheduled: no  Notes to clinic:  overdue for a office visit  Message sent to patient to contact office    Requested Prescriptions  Pending Prescriptions Disp Refills   amLODipine ([place]) 5 MG tablet [Pharmacy Med Name: AMLODIPINE BESYLATE 5MG  TABLETS] 90 tablet 0    Sig: TAKE 1 TABLET BY MOUTH EVERY DAY      Cardiovascular:  Calcium Channel Blockers Failed - 01/10/2021  9:16 AM      Failed - Last BP in normal range    BP Readings from Last 1 Encounters:  09/26/20 140/80          Failed - Valid encounter within last 6 months    Recent Outpatient Visits           1 year ago Dizziness of unknown cause   [place], Vickki Muff, PA-C   2 years ago [place], Vickki Muff, PA-C   2 years ago [place], Donald E, MD   3 years ago Annual physical exam   [facility], Vickki Muff, PA-C   3 years ago Pure hypercholesterolemia   [facility], Vickki Muff, [place]

## 2021-01-23 ENCOUNTER — Emergency Department
Admission: EM | Admit: 2021-01-23 | Discharge: 2021-01-23 | Disposition: A | Discharge status: Home / Self Care | Payer: Medicare Other

## 2021-01-24 DIAGNOSIS — Z5181 Encounter for therapeutic drug level monitoring: Secondary | ICD-10-CM | POA: Diagnosis not present

## 2021-01-24 DIAGNOSIS — N131 Hydronephrosis with ureteral stricture, not elsewhere classified: Secondary | ICD-10-CM | POA: Diagnosis not present

## 2021-01-24 DIAGNOSIS — Z96651 Presence of right artificial knee joint: Secondary | ICD-10-CM | POA: Diagnosis not present

## 2021-01-24 DIAGNOSIS — Z20822 Contact with and (suspected) exposure to covid-19: Secondary | ICD-10-CM | POA: Diagnosis not present

## 2021-01-24 DIAGNOSIS — N132 Hydronephrosis with renal and ureteral calculous obstruction: Secondary | ICD-10-CM | POA: Diagnosis not present

## 2021-01-24 DIAGNOSIS — K5730 Diverticulosis of large intestine without perforation or abscess without bleeding: Secondary | ICD-10-CM | POA: Diagnosis not present

## 2021-01-24 DIAGNOSIS — K7689 Other specified diseases of liver: Secondary | ICD-10-CM | POA: Diagnosis not present

## 2021-01-24 DIAGNOSIS — N178 Other acute kidney failure: Secondary | ICD-10-CM | POA: Diagnosis not present

## 2021-01-24 DIAGNOSIS — Z79899 Other long term (current) drug therapy: Secondary | ICD-10-CM | POA: Diagnosis not present

## 2021-01-24 DIAGNOSIS — N200 Calculus of kidney: Secondary | ICD-10-CM | POA: Diagnosis not present

## 2021-01-25 ENCOUNTER — Ambulatory Visit: Payer: Medicare Other | Admitting: Family Medicine

## 2021-01-25 DIAGNOSIS — N200 Calculus of kidney: Secondary | ICD-10-CM | POA: Diagnosis not present

## 2021-01-25 DIAGNOSIS — N178 Other acute kidney failure: Secondary | ICD-10-CM | POA: Diagnosis not present

## 2021-02-01 ENCOUNTER — Telehealth: Payer: Self-pay | Admitting: Family Medicine

## 2021-02-01 NOTE — Telephone Encounter (Signed)
Ok to refill? Medication is not on pt's med list. Please advise. Thanks!

## 2021-02-01 NOTE — Telephone Encounter (Signed)
Copied from [place] [id number]. Topic: Quick Communication - Rx Refill/Question >> Feb 01, 2021  8:43 AM Valere Dross wrote: Medication: Flomax  Has the patient contacted their pharmacy? No, pt called stating currently passing a kindey stone, his Urologist prescribed this medication for him and pt is concerned he wont pass his kidney stone by Friday, when he runs out of medication.    Preferred Pharmacy (with phone number or street name): [id number] - [place], [place] - [place]  Phone:  [phone] Fax:  [phone]    Agent: Please be advised that RX refills may take up to 3 business days. We ask that you follow-up with your pharmacy.

## 2021-02-02 ENCOUNTER — Other Ambulatory Visit: Payer: Self-pay | Admitting: Family Medicine

## 2021-02-02 DIAGNOSIS — N200 Calculus of kidney: Secondary | ICD-10-CM

## 2021-02-02 MED ORDER — TAMSULOSIN HCL 0.4 MG PO CAPS: 0.4000 mg | ORAL_CAPSULE | Freq: Every day | ORAL | 3 refills | Status: DC

## 2021-02-02 NOTE — Telephone Encounter (Signed)
Pt was following up on his previous request, stated it is very important to get the prescription for flomax. Requested a call back as soon as possible to discuss.

## 2021-02-02 NOTE — Telephone Encounter (Signed)
Pt called to check on refill status for Flomax/ Pt states he will run out of medication after tomorrow and wants to make sure he can pass kidney stone and wont run out of medication/ please advise

## 2021-02-02 NOTE — Telephone Encounter (Signed)
Mrs. Levingston advised.  (On DPR)  Thanks,   -Mickel Baas

## 2021-02-02 NOTE — Telephone Encounter (Signed)
Sent medication to your pharmacy. Should recheck with your urologist if this does not allow you to pass the kidney stone.

## 2021-02-17 ENCOUNTER — Ambulatory Visit: Payer: Medicare Other | Admitting: Family Medicine

## 2021-02-24 DIAGNOSIS — N201 Calculus of ureter: Secondary | ICD-10-CM | POA: Diagnosis not present

## 2021-03-16 ENCOUNTER — Other Ambulatory Visit: Payer: Self-pay | Admitting: Family Medicine

## 2021-03-23 DIAGNOSIS — Z79890 Hormone replacement therapy (postmenopausal): Secondary | ICD-10-CM | POA: Diagnosis not present

## 2021-03-23 DIAGNOSIS — N132 Hydronephrosis with renal and ureteral calculous obstruction: Secondary | ICD-10-CM | POA: Diagnosis not present

## 2021-03-23 DIAGNOSIS — N201 Calculus of ureter: Secondary | ICD-10-CM | POA: Diagnosis not present

## 2021-03-23 DIAGNOSIS — I10 Essential (primary) hypertension: Secondary | ICD-10-CM | POA: Diagnosis not present

## 2021-03-23 DIAGNOSIS — N1330 Unspecified hydronephrosis: Secondary | ICD-10-CM | POA: Diagnosis not present

## 2021-03-23 DIAGNOSIS — K219 Gastro-esophageal reflux disease without esophagitis: Secondary | ICD-10-CM | POA: Diagnosis not present

## 2021-03-23 DIAGNOSIS — N210 Calculus in bladder: Secondary | ICD-10-CM | POA: Diagnosis not present

## 2021-03-23 DIAGNOSIS — Z79899 Other long term (current) drug therapy: Secondary | ICD-10-CM | POA: Diagnosis not present

## 2021-04-13 DIAGNOSIS — Z85820 Personal history of malignant melanoma of skin: Secondary | ICD-10-CM | POA: Diagnosis not present

## 2021-04-13 DIAGNOSIS — X32XXXA Exposure to sunlight, initial encounter: Secondary | ICD-10-CM | POA: Diagnosis not present

## 2021-04-13 DIAGNOSIS — L570 Actinic keratosis: Secondary | ICD-10-CM | POA: Diagnosis not present

## 2021-04-13 DIAGNOSIS — D225 Melanocytic nevi of trunk: Secondary | ICD-10-CM | POA: Diagnosis not present

## 2021-04-13 DIAGNOSIS — D2271 Melanocytic nevi of right lower limb, including hip: Secondary | ICD-10-CM | POA: Diagnosis not present

## 2021-04-13 DIAGNOSIS — Z85828 Personal history of other malignant neoplasm of skin: Secondary | ICD-10-CM | POA: Diagnosis not present

## 2021-04-13 DIAGNOSIS — D2261 Melanocytic nevi of right upper limb, including shoulder: Secondary | ICD-10-CM | POA: Diagnosis not present

## 2021-05-17 DIAGNOSIS — N200 Calculus of kidney: Secondary | ICD-10-CM | POA: Diagnosis not present

## 2021-05-17 DIAGNOSIS — Z09 Encounter for follow-up examination after completed treatment for conditions other than malignant neoplasm: Secondary | ICD-10-CM | POA: Diagnosis not present

## 2021-05-17 DIAGNOSIS — N201 Calculus of ureter: Secondary | ICD-10-CM | POA: Diagnosis not present

## 2021-06-12 ENCOUNTER — Telehealth: Payer: Self-pay | Admitting: Family Medicine

## 2021-06-12 ENCOUNTER — Other Ambulatory Visit: Payer: Self-pay

## 2021-06-12 DIAGNOSIS — N200 Calculus of kidney: Secondary | ICD-10-CM

## 2021-06-12 MED ORDER — TAMSULOSIN HCL 0.4 MG PO CAPS: 0.4000 mg | ORAL_CAPSULE | Freq: Every day | ORAL | 3 refills | Status: DC

## 2021-06-12 NOTE — Telephone Encounter (Signed)
[facility] faxed refill request for the following medications: ° °tamsulosin (FLOMAX) 0.4 MG CAPS capsule  ° °Please advise. ° °

## 2021-06-14 MED ORDER — AMLODIPINE BESYLATE 5 MG PO TABS: 5.0000 mg | ORAL_TABLET | Freq: Every day | ORAL | 0 refills | Status: DC

## 2021-06-14 NOTE — Telephone Encounter (Signed)
Rx sent to pharmacy. Patient needs ov before more refills are given.

## 2021-06-14 NOTE — Telephone Encounter (Signed)
[facility] faxed refill request for the following medications:   amLODipine (NORVASC) 5 MG tablet   Please advise.  

## 2021-07-17 DIAGNOSIS — N200 Calculus of kidney: Secondary | ICD-10-CM | POA: Diagnosis not present

## 2021-08-03 ENCOUNTER — Ambulatory Visit (INDEPENDENT_AMBULATORY_CARE_PROVIDER_SITE_OTHER): Payer: Medicare Other | Admitting: Podiatry

## 2021-08-03 ENCOUNTER — Other Ambulatory Visit: Payer: Self-pay

## 2021-08-03 DIAGNOSIS — L603 Nail dystrophy: Secondary | ICD-10-CM

## 2021-08-08 ENCOUNTER — Encounter: Payer: Self-pay | Admitting: Podiatry

## 2021-08-08 NOTE — Progress Notes (Signed)
Subjective:  Patient ID: Joseph Whitaker, male    DOB: November 04, 1945,  MRN: [id number]  Chief Complaint  Patient presents with   Nail Problem    Left hallux nail is loose     75 y.o. male presents with the above complaint.  Patient presents with left hallux discolored dystrophic mycotic nail.  Patient states is painful to touch is partially loose.  Patient would like to have it completely removed and made permanent.  He states is painful to walk on painful to ambulate he does not want to get caught and completely come off.  He denies any other acute complaints he has not seen anyone else prior to seeing me.  He is not a diabetic   Review of Systems: Negative except as noted in the HPI. Denies N/V/F/Ch.  Past Medical History:  Diagnosis Date   Arthritis    Hyperlipidemia    Hypertension    Hypothyroidism     Current Outpatient Medications:    amLODipine (NORVASC) 5 MG tablet, Take 1 tablet (5 mg total) by mouth daily., Disp: 90 tablet, Rfl: 0   levothyroxine (SYNTHROID) 50 MCG tablet, Take 1 tablet (50 mcg total) by mouth daily before breakfast., Disp: 90 tablet, Rfl: 3   meclizine (ANTIVERT) 12.5 MG tablet, Take 1-2 tablets (12.5-25 mg total) by mouth 3 (three) times daily. (Patient taking differently: Take 12.5-25 mg by mouth 3 (three) times daily as needed.), Disp: 30 tablet, Rfl: 0   Misc Natural Products (CHOLESTEROL SUPPORT PO), Take 1 tablet by mouth 2 (two) times daily. Chlor Support, Disp: , Rfl:    tamsulosin (FLOMAX) 0.4 MG CAPS capsule, Take 1 capsule (0.4 mg total) by mouth daily., Disp: 30 capsule, Rfl: 3  Social History   Tobacco Use  Smoking Status Former   Types: Cigarettes   Quit date: 09/03/1985   Years since quitting: 35.9  Smokeless Tobacco Never    Allergies  Allergen Reactions   Red Yeast Rice [Cholestin] Other (See Comments)    Dizziness   Statins Other (See Comments)    Weight loss, malaise.   Objective:  There were no vitals filed for this  visit. There is no height or weight on file to calculate BMI. Constitutional Well developed. Well nourished.  Vascular Dorsalis pedis pulses palpable bilaterally. Posterior tibial pulses palpable bilaterally. Capillary refill normal to all digits.  No cyanosis or clubbing noted. Pedal hair growth normal.  Neurologic Normal speech. Oriented to person, place, and time. Epicritic sensation to light touch grossly present bilaterally.  Dermatologic Pain on palpation of the entire/total nail on 1st digit of the left No other open wounds. No skin lesions.  Orthopedic: Normal joint ROM without pain or crepitus bilaterally. No visible deformities. No bony tenderness.   Radiographs: None Assessment:   1. Nail dystrophy    Plan:  Patient was evaluated and treated and all questions answered.  Nail contusion/dystrophy hallux, left -Patient elects to proceed with minor surgery to remove entire toenail today. Consent reviewed and signed by patient. -Entire/total nail excised. See procedure note. -Educated on post-procedure care including soaking. Written instructions provided and reviewed. -Patient to follow up in 2 weeks for nail check.  Procedure: Excision of entire/total nail with phenol matricectomy Location: Left 1st toe digit Anesthesia: Lidocaine 1% plain; 1.5 mL and Marcaine 0.5% plain; 1.5 mL, digital block. Skin Prep: Betadine. Dressing: Silvadene; telfa; dry, sterile, compression dressing. Technique: Following skin prep, the toe was exsanguinated and a tourniquet was secured at the base of the  toe. The affected nail border was freed and excised.  Phenol matricectomy was performed in standard technique.  The tourniquet was then removed and sterile dressing applied. Disposition: Patient tolerated procedure well. Patient to return in 2 weeks for follow-up.   No follow-ups on file.

## 2021-09-11 ENCOUNTER — Encounter: Payer: Medicare Other | Admitting: Physician Assistant

## 2021-09-12 ENCOUNTER — Other Ambulatory Visit: Payer: Self-pay | Admitting: Family Medicine

## 2021-09-12 DIAGNOSIS — H52223 Regular astigmatism, bilateral: Secondary | ICD-10-CM | POA: Diagnosis not present

## 2021-09-12 DIAGNOSIS — H5213 Myopia, bilateral: Secondary | ICD-10-CM | POA: Diagnosis not present

## 2021-09-12 DIAGNOSIS — H524 Presbyopia: Secondary | ICD-10-CM | POA: Diagnosis not present

## 2021-09-12 NOTE — Telephone Encounter (Signed)
Requested medication (s) are due for refill today: yes  Requested medication (s) are on the active medication list: yes  Last refill:  06/14/21  Future visit scheduled: 09/18/21  Notes to clinic:  Has already had a curtesy refill and there is an upcoming appointment scheduled next week, please assess.   Requested Prescriptions  Pending Prescriptions Disp Refills   amLODipine (NORVASC) 5 MG tablet [Pharmacy Med Name: AMLODIPINE BESYLATE 5MG  TABLETS] 90 tablet 0    Sig: TAKE 1 TABLET(5 MG) BY MOUTH DAILY NEEDS OFFICE VISIT     Cardiovascular:  Calcium Channel Blockers Failed - 09/12/2021  6:38 AM      Failed - Last BP in normal range    BP Readings from Last 1 Encounters:  09/26/20 140/80          Failed - Valid encounter within last 6 months    Recent Outpatient Visits           1 year ago Dizziness of unknown cause   [place], PA-C   2 years ago [place], Vickki Muff, PA-C   3 years ago [place], Donald E, MD   3 years ago Annual physical exam   [facility], Vickki Muff, PA-C   4 years ago Pure hypercholesterolemia   [facility], Vickki Muff, [place]

## 2021-09-18 ENCOUNTER — Ambulatory Visit (INDEPENDENT_AMBULATORY_CARE_PROVIDER_SITE_OTHER): Payer: Medicare Other | Admitting: Physician Assistant

## 2021-09-18 ENCOUNTER — Encounter: Payer: Self-pay | Admitting: Physician Assistant

## 2021-09-18 ENCOUNTER — Other Ambulatory Visit: Payer: Self-pay

## 2021-09-18 VITALS — BP 152/82 | HR 88 | Temp 98.2°F | Ht 69.0 in | Wt 172.7 lb

## 2021-09-18 DIAGNOSIS — N401 Enlarged prostate with lower urinary tract symptoms: Secondary | ICD-10-CM

## 2021-09-18 DIAGNOSIS — E039 Hypothyroidism, unspecified: Secondary | ICD-10-CM

## 2021-09-18 DIAGNOSIS — Z0000 Encounter for general adult medical examination without abnormal findings: Secondary | ICD-10-CM | POA: Diagnosis not present

## 2021-09-18 DIAGNOSIS — D649 Anemia, unspecified: Secondary | ICD-10-CM | POA: Diagnosis not present

## 2021-09-18 DIAGNOSIS — G5601 Carpal tunnel syndrome, right upper limb: Secondary | ICD-10-CM | POA: Diagnosis not present

## 2021-09-18 DIAGNOSIS — I10 Essential (primary) hypertension: Secondary | ICD-10-CM | POA: Diagnosis not present

## 2021-09-18 DIAGNOSIS — R350 Frequency of micturition: Secondary | ICD-10-CM

## 2021-09-18 DIAGNOSIS — E785 Hyperlipidemia, unspecified: Secondary | ICD-10-CM | POA: Diagnosis not present

## 2021-09-18 DIAGNOSIS — E038 Other specified hypothyroidism: Secondary | ICD-10-CM | POA: Insufficient documentation

## 2021-09-18 MED ORDER — LEVOTHYROXINE SODIUM 50 MCG PO TABS: 50.0000 ug | ORAL_TABLET | Freq: Every day | ORAL | 3 refills | Status: DC

## 2021-09-18 MED ORDER — AMLODIPINE BESYLATE 5 MG PO TABS: 5.0000 mg | ORAL_TABLET | Freq: Every day | ORAL | 1 refills | Status: DC

## 2021-09-18 MED ORDER — TAMSULOSIN HCL 0.4 MG PO CAPS: 0.4000 mg | ORAL_CAPSULE | Freq: Every day | ORAL | 3 refills | Status: DC

## 2021-09-18 NOTE — Patient Instructions (Addendum)
Carpal Tunnel Wrist Brace

## 2021-09-18 NOTE — Assessment & Plan Note (Signed)
Historically will recheck 

## 2021-09-18 NOTE — Assessment & Plan Note (Signed)
Advised wrist brace at night. Explained etiology of repetitive movements. If symptoms increase or occur during the day despite night brace use can f/u

## 2021-09-18 NOTE — Assessment & Plan Note (Signed)
Recheck for guidelines w/ diet and exercise. Likely would not start a statin unless significant elevation.

## 2021-09-18 NOTE — Assessment & Plan Note (Signed)
Recheck tsh/t4 

## 2021-09-18 NOTE — Progress Notes (Addendum)
Addendum 11/09/2021:  Pt completed guiac stool test, negative. Given age, negative test, and pt's request will refrain from colonoscopy for now. Other recent tests-- iron panel, folate, vit b12, all wnl and non diagnostic to explain anemia.  For now, will monitor CBC and if there is a progression, refer to hematology.  Given recent exam and follow up, patient is cleared for surgery given moderate risk d/t stable anemia and age; pending pre-op bloodwork. Discussion with patient to not order bloodwork myself to reassess BMP/CBC as he has some pended w/ surgeon's office. Can clear pending these labs.    I,Rosario Duey,acting as a Education administrator for [employer], PA-C.,have documented all relevant documentation on the behalf of [name], PA-C,as directed by  [name], PA-C while in the presence of [name], PA-C.  Annual Wellness Visit     Patient: Joseph Whitaker, Male    DOB: [birth year], 76 y.o.   MRN: [id number] Visit Date: 09/18/2021  Today's Provider: [name], PA-C   Cc. CPE  Subjective    Joseph Whitaker is a 76 y.o. male who presents today for his Annual Wellness Visit. He reports consuming a general diet. The patient has a physically strenuous job, but has no regular exercise apart from work.  He generally feels well. He reports sleeping well. He does have additional problems to discuss today.   HPI He reports numbness in right hand for a few months, when he is lying down at night. Denies numbness during the day. Sensation always returns. Denies discoloration.   Medications: Outpatient Medications Prior to Visit  Medication Sig   Misc Natural Products (CHOLESTEROL SUPPORT PO) Take 1 tablet by mouth 2 (two) times daily. Chlor Support   [DISCONTINUED] amLODipine (NORVASC) 5 MG tablet Take 1 tablet (5 mg total) by mouth daily.   [DISCONTINUED] levothyroxine (SYNTHROID) 50 MCG tablet Take 1 tablet (50 mcg total) by mouth daily before breakfast.    [DISCONTINUED] tamsulosin (FLOMAX) 0.4 MG CAPS capsule Take 1 capsule (0.4 mg total) by mouth daily.   [DISCONTINUED] meclizine (ANTIVERT) 12.5 MG tablet Take 1-2 tablets (12.5-25 mg total) by mouth 3 (three) times daily. (Patient not taking: Reported on 09/18/2021)   No facility-administered medications prior to visit.    Allergies  Allergen Reactions   Red Yeast Rice [Cholestin] Other (See Comments)    Dizziness   Statins Other (See Comments)    Weight loss, malaise.    Patient Care Team: Chrismon, Vickki Muff, PA-C (Inactive) as PCP - General (Family Medicine)  Review of Systems  Genitourinary:  Positive for frequency.  Neurological:  Positive for numbness.  All other systems reviewed and are negative.     Objective    Vitals: BP (!) 152/82    Pulse 88    Temp 98.2 F (36.8 C) (Oral)    Ht 5\' 9"  (1.753 m)    Wt 172 lb 11.2 oz (78.3 kg)    SpO2 97%    BMI 25.50 kg/m  BP Readings from Last 3 Encounters:  09/18/21 (!) 152/82  09/26/20 140/80  12/29/19 118/72   Wt Readings from Last 3 Encounters:  09/18/21 172 lb 11.2 oz (78.3 kg)  09/26/20 174 lb (78.9 kg)  12/29/19 170 lb (77.1 kg)      Physical Exam Constitutional:      General: He is awake.     Appearance: He is well-developed.  HENT:     Head: Normocephalic.     Right Ear: Tympanic membrane, ear canal and  external ear normal.     Left Ear: Tympanic membrane, ear canal and external ear normal.     Nose: Nose normal. No congestion or rhinorrhea.     Mouth/Throat:     Mouth: Mucous membranes are moist.     Pharynx: No oropharyngeal exudate or posterior oropharyngeal erythema.  Eyes:     Pupils: Pupils are equal, round, and reactive to light.  Cardiovascular:     Rate and Rhythm: Normal rate and regular rhythm.     Heart sounds: Normal heart sounds.  Pulmonary:     Effort: Pulmonary effort is normal.     Breath sounds: Normal breath sounds.  Abdominal:     General: There is no distension.     Palpations:  Abdomen is soft.     Tenderness: There is no abdominal tenderness. There is no guarding.  Musculoskeletal:     Cervical back: Normal range of motion.     Right lower leg: No edema.     Left lower leg: No edema.     Comments: Right wrist with + phalen's test, no discoloration, equal in warmth to left hand.  Lymphadenopathy:     Cervical: No cervical adenopathy.  Skin:    General: Skin is warm.  Neurological:     Mental Status: He is alert and oriented to person, place, and time.  Psychiatric:        Attention and Perception: Attention normal.        Mood and Affect: Mood normal.        Speech: Speech normal.        Behavior: Behavior normal. Behavior is cooperative.    Most recent functional status assessment: In your present state of health, do you have any difficulty performing the following activities: 09/18/2021  Hearing? N  Vision? Y  Comment wears glasses  Difficulty concentrating or making decisions? N  Walking or climbing stairs? N  Dressing or bathing? N  Doing errands, shopping? N  Some recent data might be hidden   Most recent fall risk assessment: Fall Risk  09/18/2021  Falls in the past year? 0  Number falls in past yr: 0  Injury with Fall? 0  Risk for fall due to : No Fall Risks  Follow up -    Most recent depression screenings: PHQ 2/9 Scores 09/18/2021 09/26/2020  PHQ - 2 Score 0 0  PHQ- 9 Score 0 1   Most recent cognitive screening: No flowsheet data found. Most recent Audit-C alcohol use screening Alcohol Use Disorder Test (AUDIT) 09/18/2021  1. How often do you have a drink containing alcohol? 0  2. How many drinks containing alcohol do you have on a typical day when you are drinking? 0  3. How often do you have six or more drinks on one occasion? 0  AUDIT-C Score 0  Alcohol Brief Interventions/Follow-up -   A score of 3 or more in women, and 4 or more in men indicates increased risk for alcohol abuse, EXCEPT if all of the points are from question 1    No results found for any visits on 09/18/21.  Assessment & Plan     Annual wellness visit done today including the all of the following: Reviewed patient's Family Medical History Reviewed and updated list of patient's medical providers Assessment of cognitive impairment was done Assessed patient's functional ability Established a written schedule for health screening [place] Completed and Reviewed  Exercise Activities and Dietary recommendations  Goals  DIET - INCREASE WATER INTAKE     Recommend increasing water intake to 6-8 glasses a day.        Immunization History  Administered Date(s) Administered   Fluad Quad(high Dose 65+) 08/10/2019   Influenza Split 08/11/2009, 08/14/2010, 08/17/2011   Influenza, High Dose Seasonal PF 08/06/2016, 09/09/2017   Tdap 04/04/2009   Zoster, Live 07/02/2008    Health Maintenance  Topic Date Due   COVID-19 Vaccine (1) 10/04/2021 (Originally 06/23/1946)   TETANUS/TDAP  11/01/2021 (Originally 04/05/2019)   INFLUENZA VACCINE  12/01/2021 (Originally 04/03/2021)   Zoster Vaccines- Shingrix (1 of 2) 12/17/2021 (Originally 12/21/1964)   Pneumonia Vaccine 53+ Years old (1 - PCV) 09/18/2022 (Originally 12/21/1951)   COLONOSCOPY (Pts 45-56yrs Insurance coverage will need to be confirmed)  11/02/2022 (Originally 11/21/2020)   Hepatitis C Screening  Completed   HPV VACCINES  Aged Out     Discussed health benefits of physical activity, and encouraged him to engage in regular exercise appropriate for his age and condition.   Declines flu vaccine, tetanus, and shingles vaccines. Education provided. Declines cologuard or colonoscopy.   Problem List Items Addressed This Visit       Cardiovascular and Mediastinum   Essential (primary) hypertension    Elevated today. Advised he check at home 2-3 times a week. Likely lower at home, reports feeling nervous to see new practitioner.       Relevant Medications   amLODipine  (NORVASC) 5 MG tablet     Endocrine   Other specified hypothyroidism    Recheck tsh/t4      Relevant Medications   levothyroxine (SYNTHROID) 50 MCG tablet     Nervous and Auditory   Carpal tunnel syndrome of right wrist    Advised wrist brace at night. Explained etiology of repetitive movements. If symptoms increase or occur during the day despite night brace use can f/u        Other   HLD (hyperlipidemia)    Recheck for guidelines w/ diet and exercise. Likely would not start a statin unless significant elevation.       Relevant Medications   amLODipine (NORVASC) 5 MG tablet   Other Relevant Orders   Comprehensive Metabolic Panel (CMET)   Lipid Profile   Benign prostatic hyperplasia with urinary frequency    Symptoms stable managed on flomax. Will recheck psa      Relevant Medications   tamsulosin (FLOMAX) 0.4 MG CAPS capsule   Other Relevant Orders   PSA   Anemia    Historically will recheck.        Relevant Orders   CBC w/Diff/Platelet   Other Visit Diagnoses     Encounter for annual wellness exam in Medicare patient    -  Primary   Encounter for physical examination             Return in about 3 months (around 12/17/2021) for hyperlipidemia, hypertension.     I, [name], PA-C have reviewed all documentation for this visit. The documentation on  09/18/2021 for the exam, diagnosis, procedures, and orders are all accurate and complete.    [name], PA-C  [facility] [phone] (phone) [phone] (fax)  [place]

## 2021-09-18 NOTE — Assessment & Plan Note (Signed)
Symptoms stable managed on flomax. Will recheck psa

## 2021-09-18 NOTE — Assessment & Plan Note (Signed)
Elevated today. Advised he check at home 2-3 times a week. Likely lower at home, reports feeling nervous to see new practitioner.

## 2021-09-19 ENCOUNTER — Other Ambulatory Visit: Payer: Self-pay | Admitting: Physician Assistant

## 2021-09-19 DIAGNOSIS — D649 Anemia, unspecified: Secondary | ICD-10-CM

## 2021-09-19 DIAGNOSIS — D539 Nutritional anemia, unspecified: Secondary | ICD-10-CM

## 2021-09-19 LAB — CBC WITH DIFFERENTIAL/PLATELET
Basophils Absolute: 0 10*3/uL (ref 0.0–0.2)
Basos: 1 %
EOS (ABSOLUTE): 0 10*3/uL (ref 0.0–0.4)
Eos: 1 %
Hematocrit: 35.1 % — ABNORMAL LOW (ref 37.5–51.0)
Hemoglobin: 12.1 g/dL — ABNORMAL LOW (ref 13.0–17.7)
Immature Grans (Abs): 0 10*3/uL (ref 0.0–0.1)
Immature Granulocytes: 0 %
Lymphocytes Absolute: 1.5 10*3/uL (ref 0.7–3.1)
Lymphs: 28 %
MCH: 31.4 pg (ref 26.6–33.0)
MCHC: 34.5 g/dL (ref 31.5–35.7)
MCV: 91 fL (ref 79–97)
Monocytes Absolute: 0.6 10*3/uL (ref 0.1–0.9)
Monocytes: 11 %
Neutrophils Absolute: 3.1 10*3/uL (ref 1.4–7.0)
Neutrophils: 59 %
Platelets: 298 10*3/uL (ref 150–450)
RBC: 3.85 x10E6/uL — ABNORMAL LOW (ref 4.14–5.80)
RDW: 13.2 % (ref 11.6–15.4)
WBC: 5.2 10*3/uL (ref 3.4–10.8)

## 2021-09-19 LAB — COMPREHENSIVE METABOLIC PANEL
ALT: 14 IU/L (ref 0–44)
AST: 18 IU/L (ref 0–40)
Albumin/Globulin Ratio: 1.7 (ref 1.2–2.2)
Albumin: 4.4 g/dL (ref 3.7–4.7)
Alkaline Phosphatase: 77 IU/L (ref 44–121)
BUN/Creatinine Ratio: 12 (ref 10–24)
BUN: 16 mg/dL (ref 8–27)
Bilirubin Total: <0.2 mg/dL (ref 0.0–1.2)
CO2: 23 mmol/L (ref 20–29)
Calcium: 9.8 mg/dL (ref 8.6–10.2)
Chloride: 104 mmol/L (ref 96–106)
Creatinine, Ser: 1.36 mg/dL — ABNORMAL HIGH (ref 0.76–1.27)
Globulin, Total: 2.6 g/dL (ref 1.5–4.5)
Glucose: 79 mg/dL (ref 70–99)
Potassium: 4.9 mmol/L (ref 3.5–5.2)
Sodium: 143 mmol/L (ref 134–144)
Total Protein: 7 g/dL (ref 6.0–8.5)
eGFR: 55 mL/min/{1.73_m2} — ABNORMAL LOW (ref >59)

## 2021-09-19 LAB — LIPID PANEL
Chol/HDL Ratio: 4.9 ratio (ref 0.0–5.0)
Cholesterol, Total: 183 mg/dL (ref 100–199)
HDL: 37 mg/dL — ABNORMAL LOW (ref >39)
LDL Chol Calc (NIH): 109 mg/dL — ABNORMAL HIGH (ref 0–99)
Triglycerides: 212 mg/dL — ABNORMAL HIGH (ref 0–149)
VLDL Cholesterol Cal: 37 mg/dL (ref 5–40)

## 2021-09-19 LAB — PSA: Prostate Specific Ag, Serum: 1.5 ng/mL (ref 0.0–4.0)

## 2021-09-19 LAB — TSH+FREE T4
Free T4: 1.09 ng/dL (ref 0.82–1.77)
TSH: 3.04 u[IU]/mL (ref 0.450–4.500)

## 2021-09-25 ENCOUNTER — Telehealth: Payer: Self-pay

## 2021-09-25 NOTE — Telephone Encounter (Signed)
Patient advised.

## 2021-09-25 NOTE — Telephone Encounter (Signed)
-----   Message from [name], PA-C sent at 09/19/2021  8:20 AM EST ----- Thyroid function looks good, no changes Cholesterol is improved, no addition of meds, continue w/ current diet and exercise Still anemic, and slowly trending downward. Would prefer he complete a cologuard or at least an FIT at home. Let me know which and I can order. Would also like to add some more bloodwork -- iron, vit b12, folate.  Kidney function is abnormal-- increase fluids, avoid NSAIDs, would like to repeat in 4 weeks.

## 2021-10-18 DIAGNOSIS — M25552 Pain in left hip: Secondary | ICD-10-CM | POA: Diagnosis not present

## 2021-10-18 DIAGNOSIS — M1612 Unilateral primary osteoarthritis, left hip: Secondary | ICD-10-CM | POA: Diagnosis not present

## 2021-10-24 DIAGNOSIS — D649 Anemia, unspecified: Secondary | ICD-10-CM | POA: Diagnosis not present

## 2021-10-24 DIAGNOSIS — D539 Nutritional anemia, unspecified: Secondary | ICD-10-CM | POA: Diagnosis not present

## 2021-10-25 LAB — IRON,TIBC AND FERRITIN PANEL
Ferritin: 167 ng/mL (ref 30–400)
Iron Saturation: 39 % (ref 15–55)
Iron: 95 ug/dL (ref 38–169)
Total Iron Binding Capacity: 245 ug/dL — ABNORMAL LOW (ref 250–450)
UIBC: 150 ug/dL (ref 111–343)

## 2021-10-25 LAB — FOLATE: Folate: >20 ng/mL (ref >3.0)

## 2021-10-25 LAB — VITAMIN B12: Vitamin B-12: 1285 pg/mL — ABNORMAL HIGH (ref 232–1245)

## 2021-10-25 LAB — RETICULOCYTES: Retic Ct Pct: 1 % (ref 0.6–2.6)

## 2021-10-30 DIAGNOSIS — M06222 Rheumatoid bursitis, left elbow: Secondary | ICD-10-CM | POA: Diagnosis not present

## 2021-11-02 ENCOUNTER — Other Ambulatory Visit: Payer: Self-pay | Admitting: Physician Assistant

## 2021-11-02 DIAGNOSIS — D649 Anemia, unspecified: Secondary | ICD-10-CM

## 2021-11-02 NOTE — Progress Notes (Signed)
Pre op ?Hgb required >12.0 ?Last was 12.1 and was dropping ? ?

## 2021-11-03 ENCOUNTER — Other Ambulatory Visit: Payer: Self-pay

## 2021-11-03 DIAGNOSIS — D649 Anemia, unspecified: Secondary | ICD-10-CM

## 2021-11-03 LAB — IFOBT (OCCULT BLOOD): IFOBT: NEGATIVE

## 2021-11-21 NOTE — Patient Instructions (Signed)
DUE TO COVID-19 ONLY ONE VISITOR  (aged 75 and older)  IS ALLOWED TO COME WITH YOU AND STAY IN THE WAITING ROOM ONLY DURING PRE OP AND PROCEDURE.   ?**NO VISITORS ARE ALLOWED IN THE SHORT STAY AREA OR RECOVERY ROOM!!** ? ?IF YOU WILL BE ADMITTED INTO THE HOSPITAL YOU ARE ALLOWED ONLY TWO SUPPORT PEOPLE DURING VISITATION HOURS ONLY (7 AM -8PM)   ?The support person(s) must pass our screening, gel in and out, and wear a mask at all times, including in the patient?s room. ?Patients must also wear a mask when staff or their support person are in the room. ?Visitors GUEST BADGE MUST BE WORN VISIBLY  ?One adult visitor may remain with you overnight and MUST be in the room by 8 P.M. ?  ? ? Your procedure is scheduled on: 12/05/21 ? ? Report to [facility] Main Entrance ? ?  Report to admitting at: 8:45 AM ? ? Call this number if you have problems the morning of surgery [phone] ? ? Do not eat food :After Midnight. ? ? After Midnight you may have the following liquids until : 8:30 AM  DAY OF SURGERY ? ?Water ?Black Coffee (sugar ok, NO MILK/CREAM OR CREAMERS)  ?Tea (sugar ok, NO MILK/CREAM OR CREAMERS) regular and decaf                             ?Plain Jell-O (NO RED)                                           ?Fruit ices (not with fruit pulp, NO RED)                                     ?Popsicles (NO RED)                                                                  ?Juice: apple, WHITE grape, WHITE cranberry ?Sports drinks like Gatorade (NO RED) ?Clear broth(vegetable,chicken,beef) ?Drink  Ensure drink AT: 8:30 AM the day of surgery.   ?  ?The day of surgery:  ?Drink ONE (1) Pre-Surgery Clear Ensure or G2 at AM the morning of surgery. Drink in one sitting. Do not sip.  ?This drink was given to you during your hospital  ?pre-op appointment visit. ?Nothing else to drink after completing the  ?Pre-Surgery Clear Ensure or G2. ?  ?       If you have questions, please contact your surgeon?s office. ?  ?Oral  Hygiene is also important to reduce your risk of infection.                                    ?Remember - BRUSH YOUR TEETH THE MORNING OF SURGERY WITH YOUR REGULAR TOOTHPASTE ? ? Do NOT smoke after Midnight ? ? Take these medicines the morning of surgery with A SIP OF WATER: amlodipine,levothyroxine. ? ?DO NOT TAKE ANY ORAL DIABETIC MEDICATIONS  DAY OF YOUR SURGERY ? ?Bring CPAP mask and tubing day of surgery. ?                  ?           You may not have any metal on your body including hair pins, jewelry, and body piercing ? ?           Do not wear lotions, powders, perfumes/cologne, or deodorant ? ?            Men may shave face and neck. ? ? Do not bring valuables to the hospital. [facility] NOT ?            RESPONSIBLE   FOR VALUABLES. ? ? Contacts, dentures or bridgework may not be worn into surgery. ? ? Bring small overnight bag day of surgery. ?  ? Patients discharged on the day of surgery will not be allowed to drive home.  Someone NEEDS to stay with you for the first 24 hours after anesthesia. ? ? Special Instructions: Bring a copy of your healthcare power of attorney and living will documents         the day of surgery if you haven't scanned them before. ? ?            Please read over the following fact sheets you were given: IF [facility] [phone] ? ?   [facility] - Preparing for Surgery ?Before surgery, you can play an important role.  Because skin is not sterile, your skin needs to be as free of germs as possible.  You can reduce the number of germs on your skin by washing with CHG (chlorahexidine gluconate) soap before surgery.  CHG is an antiseptic cleaner which kills germs and bonds with the skin to continue killing germs even after washing. ?Please DO NOT use if you have an allergy to CHG or antibacterial soaps.  If your skin becomes reddened/irritated stop using the CHG and inform your nurse when you arrive at Short Stay. ?Do not shave  (including legs and underarms) for at least 48 hours prior to the first CHG shower.  You may shave your face/neck. ?Please follow these instructions carefully: ? 1.  Shower with CHG Soap the night before surgery and the  morning of Surgery. ? 2.  If you choose to wash your hair, wash your hair first as usual with your  normal  shampoo. ? 3.  After you shampoo, rinse your hair and body thoroughly to remove the  shampoo.                           4.  Use CHG as you would any other liquid soap.  You can apply chg directly  to the skin and wash  ?                     Gently with a scrungie or clean washcloth. ? 5.  Apply the CHG Soap to your body ONLY FROM THE NECK DOWN.   Do not use on face/ open      ?                     Wound or open sores. Avoid contact with eyes, ears mouth and genitals (private parts).  ?  Wash face,  Genitals (private parts) with your normal soap. ?            6.  Wash thoroughly, paying special attention to the area where your surgery  will be performed. ? 7.  Thoroughly rinse your body with warm water from the neck down. ? 8.  DO NOT shower/wash with your normal soap after using and rinsing off  the CHG Soap. ?               9.  Pat yourself dry with a clean towel. ?           10.  Wear clean pajamas. ?           11.  Place clean sheets on your bed the night of your first shower and do not  sleep with pets. ?Day of Surgery : ?Do not apply any lotions/deodorants the morning of surgery.  Please wear clean clothes to the hospital/surgery center. ? ?FAILURE TO FOLLOW THESE INSTRUCTIONS MAY RESULT IN THE CANCELLATION OF YOUR SURGERY ?PATIENT SIGNATURE_________________________________ ? ?NURSE SIGNATURE__________________________________ ? ?________________________________________________________________________  ? ?Incentive Spirometer ? ?An incentive spirometer is a tool that can help keep your lungs clear and active. This tool measures how well you are filling your lungs with  each breath. Taking long deep breaths may help reverse or decrease the chance of developing breathing (pulmonary) problems (especially infection) following: ?A long period of time when you are unable to move or be active. ?BEFORE THE PROCEDURE  ?If the spirometer includes an indicator to show your best effort, your nurse or respiratory therapist will set it to a desired goal. ?If possible, sit up straight or lean slightly forward. Try not to slouch. ?Hold the incentive spirometer in an upright position. ?INSTRUCTIONS FOR USE  ?Sit on the edge of your bed if possible, or sit up as far as you can in bed or on a chair. ?Hold the incentive spirometer in an upright position. ?Breathe out normally. ?Place the mouthpiece in your mouth and seal your lips tightly around it. ?Breathe in slowly and as deeply as possible, raising the piston or the ball toward the top of the column. ?Hold your breath for 3-5 seconds or for as long as possible. Allow the piston or ball to fall to the bottom of the column. ?Remove the mouthpiece from your mouth and breathe out normally. ?Rest for a few seconds and repeat Steps 1 through 7 at least 10 times every 1-2 hours when you are awake. Take your time and take a few normal breaths between deep breaths. ?The spirometer may include an indicator to show your best effort. Use the indicator as a goal to work toward during each repetition. ?After each set of 10 deep breaths, practice coughing to be sure your lungs are clear. If you have an incision (the cut made at the time of surgery), support your incision when coughing by placing a pillow or rolled up towels firmly against it. ?Once you are able to get out of bed, walk around indoors and cough well. You may stop using the incentive spirometer when instructed by your caregiver.  ?RISKS AND COMPLICATIONS ?Take your time so you do not get dizzy or light-headed. ?If you are in pain, you may need to take or ask for pain medication before doing  incentive spirometry. It is harder to take a deep breath if you are having pain. ?AFTER USE ?Rest and breathe slowly and easily. ?It can be helpful to keep track  of a log of your progress. Your caregiver can prov

## 2021-11-22 ENCOUNTER — Other Ambulatory Visit: Payer: Self-pay

## 2021-11-22 ENCOUNTER — Encounter (HOSPITAL_COMMUNITY)
Admission: RE | Admit: 2021-11-22 | Discharge: 2021-11-22 | Disposition: A | Discharge status: Home / Self Care | Payer: Medicare Other | Source: Ambulatory Visit | Attending: Orthopedic Surgery | Admitting: Orthopedic Surgery

## 2021-11-22 ENCOUNTER — Encounter (HOSPITAL_COMMUNITY): Payer: Self-pay

## 2021-11-22 VITALS — BP 147/92 | HR 63 | Temp 97.8°F | Ht 69.0 in | Wt 170.0 lb

## 2021-11-22 DIAGNOSIS — Z01818 Encounter for other preprocedural examination: Secondary | ICD-10-CM

## 2021-11-22 DIAGNOSIS — M1612 Unilateral primary osteoarthritis, left hip: Secondary | ICD-10-CM

## 2021-11-22 HISTORY — DX: Malignant (primary) neoplasm, unspecified: C80.1

## 2021-11-22 LAB — COMPREHENSIVE METABOLIC PANEL
ALT: 21 U/L (ref 0–44)
AST: 24 U/L (ref 15–41)
Albumin: 4.2 g/dL (ref 3.5–5.0)
Alkaline Phosphatase: 58 U/L (ref 38–126)
Anion gap: 5 (ref 5–15)
BUN: 17 mg/dL (ref 8–23)
CO2: 27 mmol/L (ref 22–32)
Calcium: 9.3 mg/dL (ref 8.9–10.3)
Chloride: 106 mmol/L (ref 98–111)
Creatinine, Ser: 0.94 mg/dL (ref 0.61–1.24)
GFR, Estimated: >60 mL/min (ref >60)
Glucose, Bld: 104 mg/dL — ABNORMAL HIGH (ref 70–99)
Potassium: 3.9 mmol/L (ref 3.5–5.1)
Sodium: 138 mmol/L (ref 135–145)
Total Bilirubin: 0.5 mg/dL (ref 0.3–1.2)
Total Protein: 7.6 g/dL (ref 6.5–8.1)

## 2021-11-22 LAB — CBC
HCT: 38.9 % — ABNORMAL LOW (ref 39.0–52.0)
Hemoglobin: 13.1 g/dL (ref 13.0–17.0)
MCH: 31.6 pg (ref 26.0–34.0)
MCHC: 33.7 g/dL (ref 30.0–36.0)
MCV: 93.7 fL (ref 80.0–100.0)
Platelets: 251 10*3/uL (ref 150–400)
RBC: 4.15 MIL/uL — ABNORMAL LOW (ref 4.22–5.81)
RDW: 12 % (ref 11.5–15.5)
WBC: 4.9 10*3/uL (ref 4.0–10.5)
nRBC: 0 % (ref 0.0–0.2)

## 2021-11-22 LAB — TYPE AND SCREEN
ABO/RH(D): O POS
Antibody Screen: NEGATIVE

## 2021-11-22 LAB — SURGICAL PCR SCREEN
MRSA, PCR: NEGATIVE
Staphylococcus aureus: NEGATIVE

## 2021-11-23 ENCOUNTER — Ambulatory Visit: Payer: Medicare Other | Admitting: Physician Assistant

## 2021-12-04 NOTE — H&P (Signed)
TOTAL HIP ADMISSION H&P ? ?Patient is admitted for left total hip arthroplasty. ? ?Subjective: ? ?Chief Complaint: left hip pain ? ?HPI: Joseph Whitaker, 76 y.o. male, has a history of pain and functional disability in the left hip(s) due to arthritis and patient has failed non-surgical conservative treatments for greater than 12 weeks to include NSAID's and/or analgesics and activity modification.  Onset of symptoms was gradual starting 2 years ago with gradually worsening course since that time.The patient noted no past surgery on the left hip(s).  Patient currently rates pain in the left hip at 7 out of 10 with activity. Patient has worsening of pain with activity and weight bearing. Patient has evidence of joint space narrowing by imaging studies. This condition presents safety issues increasing the risk of falls.   There is no current active infection. ? ?Patient Active Problem List  ? Diagnosis Date Noted  ? Other specified hypothyroidism 09/18/2021  ? Carpal tunnel syndrome of right wrist 09/18/2021  ? Benign prostatic hyperplasia with urinary frequency 09/18/2021  ? Anemia 09/18/2021  ? Enlarged prostate on rectal examination 12/20/2016  ? S/P right TKA 09/18/2016  ? S/P knee replacement 09/18/2016  ? Lesion of penis 11/23/2015  ? Pneumococcal vaccination declined 11/23/2015  ? Cannot sleep 12/29/2009  ? Excessive urination at night 07/02/2008  ? Need for vaccination 07/02/2008  ? CD (contact dermatitis) 01/19/2008  ? Essential (primary) hypertension 06/16/2007  ? HLD (hyperlipidemia) 08/10/2003  ? ?Past Medical History:  ?Diagnosis Date  ? Arthritis   ? Cancer [facility])   ? Hyperlipidemia   ? Hypertension   ? Hypothyroidism   ?  ?Past Surgical History:  ?Procedure Laterality Date  ? TOTAL KNEE ARTHROPLASTY Right 09/18/2016  ? Procedure: Right TOTAL KNEE ARTHROPLASTY;  Surgeon: Paralee Cancel, MD;  Location: [facility];  Service: Orthopedics;  Laterality: Right;  Adductor Block  ?  ?No current facility-administered  medications for this encounter.  ? ?Current Outpatient Medications  ?Medication Sig Dispense Refill Last Dose  ? amLODipine (NORVASC) 5 MG tablet Take 1 tablet (5 mg total) by mouth daily. 90 tablet 1   ? levothyroxine (SYNTHROID) 50 MCG tablet Take 1 tablet (50 mcg total) by mouth daily before breakfast. 90 tablet 3   ? Misc Natural Products (CHOLESTEROL SUPPORT PO) Take 1 tablet by mouth 2 (two) times daily. Chlor Support     ? tamsulosin (FLOMAX) 0.4 MG CAPS capsule Take 1 capsule (0.4 mg total) by mouth daily. (Patient not taking: Reported on 11/20/2021) 90 capsule 3 Not Taking  ? ?Allergies  ?Allergen Reactions  ? Red Yeast Rice [Cholestin] Other (See Comments)  ?  Dizziness  ? Statins Other (See Comments)  ?  Weight loss, malaise.  ?  ?Social History  ? ?Tobacco Use  ? Smoking status: Former  ?  Types: Cigarettes  ?  Quit date: 09/03/1985  ?  Years since quitting: 36.2  ? Smokeless tobacco: Never  ?Substance Use Topics  ? Alcohol use: Not Currently  ?  Alcohol/week: 0.0 - 1.0 standard drinks  ?  ?Family History  ?Problem Relation Age of Onset  ? Dementia Father   ? Hyperlipidemia Sister   ? Heart attack Brother   ? Heart disease Maternal Grandmother   ? Heart disease Maternal Grandfather   ? Colon cancer Paternal Grandfather   ? Arthritis Brother   ? Hyperlipidemia Brother   ? Hyperlipidemia Brother   ?  ? ?Review of Systems  ?Constitutional:  Negative for chills and fever.  ?  Respiratory:  Negative for cough and shortness of breath.   ?Cardiovascular:  Negative for chest pain.  ?Gastrointestinal:  Negative for nausea and vomiting.  ?Musculoskeletal:  Positive for arthralgias.  ? ? ?Objective: ? ?Physical Exam ?Well nourished and well developed. ?General: Alert and oriented x3, cooperative and pleasant, no acute distress. ?Head: normocephalic, atraumatic, neck supple. ?Eyes: EOMI. ? ?Musculoskeletal: ?Left hip exam: ?Painful and limited hip flexion internal rotation to 5 degrees with pelvic tilting with external  rotation limited to 20 degrees ?Neurovascularly intact distally without lower extremity edema, erythema or calf tenderness ? ?Calves soft and nontender. Motor function intact in LE. Strength 5/5 LE bilaterally. ?Neuro: Distal pulses 2+. Sensation to light touch intact in LE. ? ?Vital signs in last 24 hours: ?  ? ?Labs: ? ? ?Estimated body mass index is 25.1 kg/m? as calculated from the following: ?  Height as of 11/22/21: '5\' 9"'$  (1.753 m). ?  Weight as of 11/22/21: 77.1 kg. ? ? ?Imaging Review ?Plain radiographs demonstrate severe degenerative joint disease of the left hip(s). The bone quality appears to be adequate for age and reported activity level. ? ? ? ? ? ?Assessment/Plan: ? ?End stage arthritis, left hip(s) ? ?The patient history, physical examination, clinical judgement of the provider and imaging studies are consistent with end stage degenerative joint disease of the left hip(s) and total hip arthroplasty is deemed medically necessary. The treatment options including medical management, injection therapy, arthroscopy and arthroplasty were discussed at length. The risks and benefits of total hip arthroplasty were presented and reviewed. The risks due to aseptic loosening, infection, stiffness, dislocation/subluxation,  thromboembolic complications and other imponderables were discussed.  The patient acknowledged the explanation, agreed to proceed with the plan and consent was signed. Patient is being admitted for inpatient treatment for surgery, pain control, PT, OT, prophylactic antibiotics, VTE prophylaxis, progressive ambulation and ADL's and discharge planning.The patient is planning to be discharged  home. ? ?Therapy Plans: HEP ?Disposition: Home with wife and kids ?Planned DVT Prophylaxis: aspirin '81mg'$  BID ?DME needed: none ?PCP: [name], PA-C, clearance received ?TXA: IV ?Allergies: NKDA ?Anesthesia Concerns: none ?BMI: 25.3 ?Last HgbA1c: Not diabetic ? ? ?Other: ?- hydrocodone, tylenol, robaxin,  celebrex ? ?Costella Hatcher, PA-C ?Orthopedic Surgery ?[facility] [place] ?[phone] ? ? ?

## 2021-12-05 ENCOUNTER — Ambulatory Visit (HOSPITAL_COMMUNITY): Payer: Medicare Other

## 2021-12-05 ENCOUNTER — Other Ambulatory Visit: Payer: Self-pay

## 2021-12-05 ENCOUNTER — Encounter (HOSPITAL_COMMUNITY)
Admission: RE | Disposition: A | Discharge status: Home / Self Care | Payer: Self-pay | Source: Ambulatory Visit | Attending: Orthopedic Surgery

## 2021-12-05 ENCOUNTER — Ambulatory Visit (HOSPITAL_BASED_OUTPATIENT_CLINIC_OR_DEPARTMENT_OTHER): Payer: Medicare Other | Admitting: Certified Registered Nurse Anesthetist

## 2021-12-05 ENCOUNTER — Observation Stay (HOSPITAL_COMMUNITY): Payer: Medicare Other

## 2021-12-05 ENCOUNTER — Observation Stay (HOSPITAL_COMMUNITY)
Admission: RE | Admit: 2021-12-05 | Discharge: 2021-12-06 | Disposition: A | Discharge status: Home / Self Care | Payer: Medicare Other | Source: Ambulatory Visit | Attending: Orthopedic Surgery | Admitting: Orthopedic Surgery

## 2021-12-05 ENCOUNTER — Encounter (HOSPITAL_COMMUNITY): Payer: Self-pay | Admitting: Orthopedic Surgery

## 2021-12-05 ENCOUNTER — Ambulatory Visit (HOSPITAL_COMMUNITY): Payer: Medicare Other | Admitting: Certified Registered Nurse Anesthetist

## 2021-12-05 DIAGNOSIS — M1612 Unilateral primary osteoarthritis, left hip: Secondary | ICD-10-CM

## 2021-12-05 DIAGNOSIS — Z87891 Personal history of nicotine dependence: Secondary | ICD-10-CM | POA: Diagnosis not present

## 2021-12-05 DIAGNOSIS — M25752 Osteophyte, left hip: Secondary | ICD-10-CM

## 2021-12-05 DIAGNOSIS — I10 Essential (primary) hypertension: Secondary | ICD-10-CM | POA: Diagnosis not present

## 2021-12-05 DIAGNOSIS — Z96651 Presence of right artificial knee joint: Secondary | ICD-10-CM | POA: Diagnosis not present

## 2021-12-05 DIAGNOSIS — Z859 Personal history of malignant neoplasm, unspecified: Secondary | ICD-10-CM | POA: Insufficient documentation

## 2021-12-05 DIAGNOSIS — Z471 Aftercare following joint replacement surgery: Secondary | ICD-10-CM | POA: Diagnosis not present

## 2021-12-05 DIAGNOSIS — Z96642 Presence of left artificial hip joint: Secondary | ICD-10-CM | POA: Diagnosis not present

## 2021-12-05 DIAGNOSIS — E039 Hypothyroidism, unspecified: Secondary | ICD-10-CM | POA: Insufficient documentation

## 2021-12-05 DIAGNOSIS — Z79899 Other long term (current) drug therapy: Secondary | ICD-10-CM | POA: Insufficient documentation

## 2021-12-05 HISTORY — PX: TOTAL HIP ARTHROPLASTY: SHX124

## 2021-12-05 SURGERY — ARTHROPLASTY, HIP, TOTAL, ANTERIOR APPROACH
Anesthesia: Spinal | Site: Hip | Laterality: Left

## 2021-12-05 MED ORDER — POVIDONE-IODINE 10 % EX SWAB
2.0000 "application " | Freq: Once | CUTANEOUS | Status: AC
  Administered 2021-12-05: 2 via TOPICAL

## 2021-12-05 MED ORDER — DEXAMETHASONE SODIUM PHOSPHATE 10 MG/ML IJ SOLN
8.0000 mg | Freq: Once | INTRAMUSCULAR | Status: AC
  Administered 2021-12-05: 8 mg via INTRAVENOUS

## 2021-12-05 MED ORDER — CELECOXIB 200 MG PO CAPS
200.0000 mg | ORAL_CAPSULE | Freq: Two times a day (BID) | ORAL | Status: DC
  Administered 2021-12-05 – 2021-12-06 (×2): 200 mg via ORAL
  Filled 2021-12-05 (×2): qty 1

## 2021-12-05 MED ORDER — CEFAZOLIN SODIUM-DEXTROSE 2-4 GM/100ML-% IV SOLN
2.0000 g | INTRAVENOUS | Status: AC
  Administered 2021-12-05: 2 g via INTRAVENOUS
  Filled 2021-12-05: qty 100

## 2021-12-05 MED ORDER — TRANEXAMIC ACID-NACL 1000-0.7 MG/100ML-% IV SOLN
1000.0000 mg | Freq: Once | INTRAVENOUS | Status: AC
  Administered 2021-12-05: 1000 mg via INTRAVENOUS
  Filled 2021-12-05: qty 100

## 2021-12-05 MED ORDER — METOCLOPRAMIDE HCL 5 MG PO TABS: 5.0000 mg | ORAL_TABLET | Freq: Three times a day (TID) | ORAL | Status: DC | PRN

## 2021-12-05 MED ORDER — CEFAZOLIN SODIUM-DEXTROSE 2-4 GM/100ML-% IV SOLN
2.0000 g | Freq: Four times a day (QID) | INTRAVENOUS | Status: AC
  Administered 2021-12-05 (×2): 2 g via INTRAVENOUS
  Filled 2021-12-05 (×2): qty 100

## 2021-12-05 MED ORDER — LACTATED RINGERS IV SOLN: INTRAVENOUS | Status: DC

## 2021-12-05 MED ORDER — ORAL CARE MOUTH RINSE: 15.0000 mL | Freq: Once | OROMUCOSAL | Status: AC

## 2021-12-05 MED ORDER — PHENYLEPHRINE HCL (PRESSORS) 10 MG/ML IV SOLN
INTRAVENOUS | Status: AC
  Filled 2021-12-05: qty 1

## 2021-12-05 MED ORDER — ASPIRIN 81 MG PO CHEW
81.0000 mg | CHEWABLE_TABLET | Freq: Two times a day (BID) | ORAL | Status: DC
  Administered 2021-12-05 – 2021-12-06 (×2): 81 mg via ORAL
  Filled 2021-12-05 (×2): qty 1

## 2021-12-05 MED ORDER — METOCLOPRAMIDE HCL 5 MG/ML IJ SOLN: 5.0000 mg | Freq: Three times a day (TID) | INTRAMUSCULAR | Status: DC | PRN

## 2021-12-05 MED ORDER — DOCUSATE SODIUM 100 MG PO CAPS
100.0000 mg | ORAL_CAPSULE | Freq: Two times a day (BID) | ORAL | Status: DC
  Administered 2021-12-05 – 2021-12-06 (×2): 100 mg via ORAL
  Filled 2021-12-05 (×2): qty 1

## 2021-12-05 MED ORDER — POLYETHYLENE GLYCOL 3350 17 G PO PACK: 17.0000 g | PACK | Freq: Every day | ORAL | Status: DC | PRN

## 2021-12-05 MED ORDER — ONDANSETRON HCL 4 MG/2ML IJ SOLN
INTRAMUSCULAR | Status: DC | PRN
  Administered 2021-12-05: 4 mg via INTRAVENOUS

## 2021-12-05 MED ORDER — BISACODYL 10 MG RE SUPP: 10.0000 mg | Freq: Every day | RECTAL | Status: DC | PRN

## 2021-12-05 MED ORDER — ACETAMINOPHEN 325 MG PO TABS: 325.0000 mg | ORAL_TABLET | Freq: Four times a day (QID) | ORAL | Status: DC | PRN

## 2021-12-05 MED ORDER — MENTHOL 3 MG MT LOZG: 1.0000 | LOZENGE | OROMUCOSAL | Status: DC | PRN

## 2021-12-05 MED ORDER — PHENOL 1.4 % MT LIQD: 1.0000 | OROMUCOSAL | Status: DC | PRN

## 2021-12-05 MED ORDER — DIPHENHYDRAMINE HCL 12.5 MG/5ML PO ELIX: 12.5000 mg | ORAL_SOLUTION | ORAL | Status: DC | PRN

## 2021-12-05 MED ORDER — ONDANSETRON HCL 4 MG/2ML IJ SOLN: 4.0000 mg | Freq: Four times a day (QID) | INTRAMUSCULAR | Status: DC | PRN

## 2021-12-05 MED ORDER — METHOCARBAMOL 500 MG PO TABS
500.0000 mg | ORAL_TABLET | Freq: Four times a day (QID) | ORAL | Status: DC | PRN
  Administered 2021-12-05 – 2021-12-06 (×3): 500 mg via ORAL
  Filled 2021-12-05 (×4): qty 1

## 2021-12-05 MED ORDER — DEXTROSE 5 % IV SOLN
500.0000 mg | Freq: Four times a day (QID) | INTRAVENOUS | Status: DC | PRN
  Filled 2021-12-05: qty 5

## 2021-12-05 MED ORDER — TRANEXAMIC ACID-NACL 1000-0.7 MG/100ML-% IV SOLN
1000.0000 mg | INTRAVENOUS | Status: AC
  Administered 2021-12-05: 1000 mg via INTRAVENOUS
  Filled 2021-12-05: qty 100

## 2021-12-05 MED ORDER — PROPOFOL 500 MG/50ML IV EMUL
INTRAVENOUS | Status: DC | PRN
  Administered 2021-12-05: 100 ug/kg/min via INTRAVENOUS

## 2021-12-05 MED ORDER — BUPIVACAINE IN DEXTROSE 0.75-8.25 % IT SOLN
INTRATHECAL | Status: DC | PRN
  Administered 2021-12-05: 2 mL via INTRATHECAL

## 2021-12-05 MED ORDER — PHENYLEPHRINE HCL-NACL 20-0.9 MG/250ML-% IV SOLN
INTRAVENOUS | Status: DC | PRN
  Administered 2021-12-05: 15 ug/min via INTRAVENOUS

## 2021-12-05 MED ORDER — MORPHINE SULFATE (PF) 2 MG/ML IV SOLN: 0.5000 mg | INTRAVENOUS | Status: DC | PRN

## 2021-12-05 MED ORDER — ONDANSETRON HCL 4 MG PO TABS: 4.0000 mg | ORAL_TABLET | Freq: Four times a day (QID) | ORAL | Status: DC | PRN

## 2021-12-05 MED ORDER — STERILE WATER FOR IRRIGATION IR SOLN
Status: DC | PRN
  Administered 2021-12-05: 2000 mL

## 2021-12-05 MED ORDER — OXYCODONE HCL 5 MG PO TABS: 5.0000 mg | ORAL_TABLET | Freq: Once | ORAL | Status: DC | PRN

## 2021-12-05 MED ORDER — OXYCODONE HCL 5 MG/5ML PO SOLN: 5.0000 mg | Freq: Once | ORAL | Status: DC | PRN

## 2021-12-05 MED ORDER — PHENYLEPHRINE 40 MCG/ML (10ML) SYRINGE FOR IV PUSH (FOR BLOOD PRESSURE SUPPORT)
PREFILLED_SYRINGE | INTRAVENOUS | Status: AC
  Filled 2021-12-05: qty 10

## 2021-12-05 MED ORDER — DEXAMETHASONE SODIUM PHOSPHATE 10 MG/ML IJ SOLN
10.0000 mg | Freq: Once | INTRAMUSCULAR | Status: AC
  Administered 2021-12-06: 10 mg via INTRAVENOUS
  Filled 2021-12-05: qty 1

## 2021-12-05 MED ORDER — SODIUM CHLORIDE 0.9 % IV SOLN: INTRAVENOUS | Status: DC

## 2021-12-05 MED ORDER — AMLODIPINE BESYLATE 5 MG PO TABS
5.0000 mg | ORAL_TABLET | Freq: Every day | ORAL | Status: DC
  Administered 2021-12-06: 5 mg via ORAL
  Filled 2021-12-05: qty 1

## 2021-12-05 MED ORDER — DEXAMETHASONE SODIUM PHOSPHATE 10 MG/ML IJ SOLN
INTRAMUSCULAR | Status: AC
  Filled 2021-12-05: qty 1

## 2021-12-05 MED ORDER — HYDROCODONE-ACETAMINOPHEN 5-325 MG PO TABS
1.0000 | ORAL_TABLET | ORAL | Status: DC | PRN
  Administered 2021-12-05 – 2021-12-06 (×2): 2 via ORAL
  Filled 2021-12-05 (×2): qty 2

## 2021-12-05 MED ORDER — SODIUM CHLORIDE 0.9 % IR SOLN
Status: DC | PRN
  Administered 2021-12-05: 1000 mL

## 2021-12-05 MED ORDER — HYDROMORPHONE HCL 1 MG/ML IJ SOLN: 0.2500 mg | INTRAMUSCULAR | Status: DC | PRN

## 2021-12-05 MED ORDER — ONDANSETRON HCL 4 MG/2ML IJ SOLN
INTRAMUSCULAR | Status: AC
  Filled 2021-12-05: qty 2

## 2021-12-05 MED ORDER — FERROUS SULFATE 325 (65 FE) MG PO TABS
325.0000 mg | ORAL_TABLET | Freq: Three times a day (TID) | ORAL | Status: DC
  Administered 2021-12-06: 325 mg via ORAL
  Filled 2021-12-05: qty 1

## 2021-12-05 MED ORDER — LEVOTHYROXINE SODIUM 50 MCG PO TABS
50.0000 ug | ORAL_TABLET | Freq: Every day | ORAL | Status: DC
  Administered 2021-12-06: 50 ug via ORAL
  Filled 2021-12-05 (×2): qty 1

## 2021-12-05 MED ORDER — CHLORHEXIDINE GLUCONATE 0.12 % MT SOLN
15.0000 mL | Freq: Once | OROMUCOSAL | Status: AC
  Administered 2021-12-05: 15 mL via OROMUCOSAL

## 2021-12-05 MED ORDER — HYDROCODONE-ACETAMINOPHEN 7.5-325 MG PO TABS
1.0000 | ORAL_TABLET | ORAL | Status: DC | PRN
  Administered 2021-12-05 (×2): 2 via ORAL
  Filled 2021-12-05 (×3): qty 2

## 2021-12-05 MED ORDER — PROMETHAZINE HCL 25 MG/ML IJ SOLN: 6.2500 mg | INTRAMUSCULAR | Status: DC | PRN

## 2021-12-05 MED ORDER — PROPOFOL 10 MG/ML IV BOLUS
INTRAVENOUS | Status: DC | PRN
  Administered 2021-12-05: 20 mg via INTRAVENOUS
  Administered 2021-12-05: 30 mg via INTRAVENOUS
  Administered 2021-12-05 (×2): 20 mg via INTRAVENOUS

## 2021-12-05 SURGICAL SUPPLY — 42 items
BAG COUNTER SPONGE SURGICOUNT (BAG) IMPLANT
BAG DECANTER FOR FLEXI CONT (MISCELLANEOUS) IMPLANT
BAG ZIPLOCK 12X15 (MISCELLANEOUS) IMPLANT
BLADE SAG 18X100X1.27 (BLADE) ×2 IMPLANT
COVER PERINEAL POST (MISCELLANEOUS) ×2 IMPLANT
COVER SURGICAL LIGHT HANDLE (MISCELLANEOUS) ×2 IMPLANT
CUP ACET PINNACLE SECTR 56MM (Hips) IMPLANT
DERMABOND ADVANCED (GAUZE/BANDAGES/DRESSINGS) ×1
DERMABOND ADVANCED .7 DNX12 (GAUZE/BANDAGES/DRESSINGS) ×1 IMPLANT
DRAPE FOOT SWITCH (DRAPES) ×2 IMPLANT
DRAPE STERI IOBAN 125X83 (DRAPES) ×2 IMPLANT
DRAPE U-SHAPE 47X51 STRL (DRAPES) ×4 IMPLANT
DRESSING AQUACEL AG SP 3.5X10 (GAUZE/BANDAGES/DRESSINGS) ×1 IMPLANT
DRSG AQUACEL AG ADV 3.5X10 (GAUZE/BANDAGES/DRESSINGS) ×1 IMPLANT
DRSG AQUACEL AG SP 3.5X10 (GAUZE/BANDAGES/DRESSINGS) ×2
DURAPREP 26ML APPLICATOR (WOUND CARE) ×2 IMPLANT
ELECT REM PT RETURN 15FT ADLT (MISCELLANEOUS) ×2 IMPLANT
ELIMINATOR HOLE APEX DEPUY (Hips) ×1 IMPLANT
GLOVE SURG ENC MOIS LTX SZ6 (GLOVE) ×2 IMPLANT
GLOVE SURG ENC MOIS LTX SZ7 (GLOVE) ×2 IMPLANT
GLOVE SURG UNDER LTX SZ6.5 (GLOVE) ×4 IMPLANT
GLOVE SURG UNDER POLY LF SZ7.5 (GLOVE) ×2 IMPLANT
GOWN STRL REUS W/ TWL LRG LVL3 (GOWN DISPOSABLE) ×2 IMPLANT
GOWN STRL REUS W/TWL LRG LVL3 (GOWN DISPOSABLE) ×2
HEAD CERAMIC 36 PLUS5 (Hips) ×1 IMPLANT
HOLDER FOLEY CATH W/STRAP (MISCELLANEOUS) ×2 IMPLANT
KIT TURNOVER KIT A (KITS) IMPLANT
PACK ANTERIOR HIP CUSTOM (KITS) ×2 IMPLANT
PINNACLE ALTRX PLUS 4 N 36X56 (Hips) ×1 IMPLANT
PINNACLE SECTOR CUP 56MM (Hips) ×2 IMPLANT
SCREW 6.5MMX35MM (Screw) ×1 IMPLANT
SPONGE T-LAP 18X18 ~~LOC~~+RFID (SPONGE) ×6 IMPLANT
STEM FEM ACTIS HIGH SZ8 (Stem) ×1 IMPLANT
SUT MNCRL AB 4-0 PS2 18 (SUTURE) ×2 IMPLANT
SUT STRATAFIX 0 PDS 27 VIOLET (SUTURE) ×2
SUT VIC AB 1 CT1 36 (SUTURE) ×6 IMPLANT
SUT VIC AB 2-0 CT1 27 (SUTURE) ×2
SUT VIC AB 2-0 CT1 TAPERPNT 27 (SUTURE) ×2 IMPLANT
SUTURE STRATFX 0 PDS 27 VIOLET (SUTURE) ×1 IMPLANT
TRAY FOLEY MTR SLVR 16FR STAT (SET/KITS/TRAYS/PACK) IMPLANT
TUBE SUCTION HIGH CAP CLEAR NV (SUCTIONS) ×2 IMPLANT
WATER STERILE IRR 1000ML POUR (IV SOLUTION) ×2 IMPLANT

## 2021-12-05 NOTE — Transfer of Care (Signed)
Immediate Anesthesia Transfer of Care Note ? ?Patient: Joseph Whitaker ? ?Procedure(s) Performed: TOTAL HIP ARTHROPLASTY ANTERIOR APPROACH (Left: Hip) ? ?Patient Location: PACU ? ?Anesthesia Type:Spinal ? ?Level of Consciousness: drowsy ? ?Airway & Oxygen Therapy: Patient Spontanous Breathing and Patient connected to face mask oxygen ? ?Post-op Assessment: Report given to RN and Post -op Vital signs reviewed and stable ? ?Post vital signs: Reviewed and stable ? ?Last Vitals:  ?Vitals Value Taken Time  ?BP 108/72 12/05/21 1301  ?Temp 36.4 ?C 12/05/21 1301  ?Pulse 58 12/05/21 1308  ?Resp 15 12/05/21 1308  ?SpO2 100 % 12/05/21 1308  ?Vitals shown include unvalidated device data. ? ?Last Pain:  ?Vitals:  ? 12/05/21 0938  ?TempSrc:   ?PainSc: 0-No pain  ?   ? ?  ? ?Complications: No notable events documented. ?

## 2021-12-05 NOTE — Interval H&P Note (Signed)
History and Physical Interval Note: ? ?12/05/2021 ?8:49 AM ? ?Joseph Whitaker  has presented today for surgery, with the diagnosis of Left hip osteoarthritis.  The various methods of treatment have been discussed with the patient and family. After consideration of risks, benefits and other options for treatment, the patient has consented to  Procedure(s): ?TOTAL HIP ARTHROPLASTY ANTERIOR APPROACH (Left) as a surgical intervention.  The patient's history has been reviewed, patient examined, no change in status, stable for surgery.  I have reviewed the patient's chart and labs.  Questions were answered to the patient's satisfaction.   ? ? ?Mauri Pole ? ? ?

## 2021-12-05 NOTE — Anesthesia Preprocedure Evaluation (Signed)
Anesthesia Evaluation  ?Patient identified by MRN, date of birth, ID band ?Patient awake ? ? ? ?Reviewed: ?Allergy & Precautions, H&P , NPO status , Patient's Chart, lab work & pertinent test results ? ?Airway ?Mallampati: II ? ?TM Distance: >3 FB ?Neck ROM: Full ? ? ? Dental ?no notable dental hx. ? ?  ?Pulmonary ?neg pulmonary ROS, former smoker,  ?  ?Pulmonary exam normal ?breath sounds clear to auscultation ? ? ? ? ? ? Cardiovascular ?Exercise Tolerance: Good ?hypertension, Pt. on medications ?negative cardio ROS ?Normal cardiovascular exam ?Rhythm:Regular Rate:Normal ? ? ?  ?Neuro/Psych ?negative neurological ROS ? negative psych ROS  ? GI/Hepatic ?negative GI ROS, Neg liver ROS,   ?Endo/Other  ?Hypothyroidism  ? Renal/GU ?negative Renal ROS  ?negative genitourinary ?  ?Musculoskeletal ? ?(+) Arthritis , Osteoarthritis,   ? Abdominal ?  ?Peds ?negative pediatric ROS ?(+)  Hematology ?negative hematology ROS ?(+)   ?Anesthesia Other Findings ? ?HTN ?Hb 12.8 ? Reproductive/Obstetrics ?negative OB ROS ? ?  ? ? ? ? ? ? ? ? ? ? ? ? ? ?  ?  ? ? ? ? ? ? ? ? ?Anesthesia Physical ? ?Anesthesia Plan ? ?ASA: 2 ? ?Anesthesia Plan: Spinal  ? ?Post-op Pain Management: Minimal or no pain anticipated  ? ?Induction: Intravenous ? ?PONV Risk Score and Plan: 1 and Ondansetron and Treatment may vary due to age or medical condition ? ?Airway Management Planned: Simple Face Mask ? ?Additional Equipment:  ? ?Intra-op Plan:  ? ?Post-operative Plan:  ? ?Informed Consent: I have reviewed the patients History and Physical, chart, labs and discussed the procedure including the risks, benefits and alternatives for the proposed anesthesia with the patient or authorized representative who has indicated his/her understanding and acceptance.  ? ? ? ?Dental advisory given ? ?Plan Discussed with: CRNA ? ?Anesthesia Plan Comments: (Lab work and procedure  confirmed with CRNA in room; platelets okay. ?Discussed  spinal anesthetic, and patient consents to the procedure:  included risk of possible headache,backache, failed block, allergic reaction, and nerve injury. This patient was asked if she had any questions or concerns before the procedure started. ? ?)  ? ? ? ? ? ? ?Anesthesia Quick Evaluation ? ?

## 2021-12-05 NOTE — Op Note (Signed)
? NAME:  Joseph Whitaker                ACCOUNT NO.: [id number]  ?   ? MEDICAL RECORD NO.: [id number]  ?   ? FACILITY:  [facility]  ?   ? PHYSICIAN:  Mauri Pole  DATE OF BIRTH:  Jan 11, 1946 ?   ? DATE OF PROCEDURE:  12/05/2021 ?   ?                             OPERATIVE REPORT  ?   ?   ? PREOPERATIVE DIAGNOSIS: Left  hip osteoarthritis.  ?   ? POSTOPERATIVE DIAGNOSIS:  Left hip osteoarthritis.  ?   ? PROCEDURE:  Left total hip replacement through an anterior approach  ? utilizing DePuy THR system, component size 56 mm pinnacle cup, a size 36+4 neutral  ? Altrex liner, a size 8 Hi Actis stem with a 36+5 delta ceramic  ? ball.  ?   ? SURGEON:  Pietro Cassis. Alvan Dame, M.D.  ?   ? ASSISTANT:  Costella Hatcher, PA-C ?   ? ANESTHESIA:  Spinal.  ?   ? SPECIMENS:  None.  ?   ? COMPLICATIONS:  None.  ?   ? BLOOD LOSS:  300 cc ?   ? DRAINS:  None.  ?   ? INDICATION OF THE PROCEDURE:  [name] is a 76 y.o. male who had  ? presented to office for evaluation of left hip pain.  Radiographs revealed  ? progressive degenerative changes with bone-on-bone  ? articulation of the  hip joint, including subchondral cystic changes and osteophytes.  The patient had painful limited range of  ? motion significantly affecting their overall quality of life and function.  The patient was failing to   ? respond to conservative measures including medications and/or injections and activity modification and at this point was ready  ? to proceed with more definitive measures.  Consent was obtained for  ? benefit of pain relief.  Specific risks of infection, DVT, component  ? failure, dislocation, neurovascular injury, and need for revision surgery were reviewed in the office as well discussion of  ? the anterior versus posterior approach were reviewed. ?   ? PROCEDURE IN DETAIL:  The patient was brought to operative theater.  ? Once adequate anesthesia, preoperative antibiotics, 2 gm of Ancef, 1 gm of Tranexamic Acid, and 10 mg of  Decadron were administered, the patient was positioned supine on the [employer] table.  Once the patient was safely positioned with adequate padding of boney prominences we predraped out the hip, and used fluoroscopy to confirm orientation of the pelvis.  ?   ? The left hip was then prepped and draped from proximal iliac crest to  ? mid thigh with a shower curtain technique.  ?   ? Time-out was performed identifying the patient, planned procedure, and the appropriate extremity.   ? ? An incision was then made 2 cm lateral to the  ? anterior superior iliac spine extending over the orientation of the  ? tensor fascia lata muscle and sharp dissection was carried down to the  ? fascia of the muscle.  ?   ? The fascia was then incised.  The muscle belly was identified and swept  ? laterally and retractor placed along the superior neck.  Following  ? cauterization of the circumflex vessels and removing some  pericapsular  ? fat, a second cobra retractor was placed on the inferior neck.  A T-capsulotomy was made along the line of the  ? superior neck to the trochanteric fossa, then extended proximally and  ? distally.  Tag sutures were placed and the retractors were then placed  ? intracapsular.  We then identified the trochanteric fossa and  ? orientation of my neck cut and then made a neck osteotomy with the femur on traction.  The femoral  ? head was removed without difficulty or complication.  Traction was let  ? off and retractors were placed posterior and anterior around the  ? acetabulum.  ?   ? The labrum and foveal tissue were debrided.  I began reaming with a 50 mm  ? reamer and reamed up to 55 mm reamer with good bony bed preparation and a 56 mm ? cup was chosen.  The final 56 mm Pinnacle cup was then impacted under fluoroscopy to confirm the depth of penetration and orientation with respect to  ? Abduction and forward flexion.  A screw was placed into the ilium followed by the hole eliminator.  The final  ? 36+4  neutral Altrex liner was impacted with good visualized rim fit.  The cup was positioned anatomically within the acetabular portion of the pelvis.  ?   ? At this point, the femur was rolled to 100 degrees.  Further capsule was  ? released off the inferior aspect of the femoral neck.  I then  ? released the superior capsule proximally.  With the leg in a neutral position the hook was placed laterally  ? along the femur under the vastus lateralis origin and elevated manually and then held in position using the hook attachment on the bed.  The leg was then extended and adducted with the leg rolled to 100  ? degrees of external rotation.  Retractors were placed along the medial calcar and posteriorly over the greater trochanter.  Once the proximal femur was fully  ? exposed, I used a box osteotome to set orientation.  I then began  ? broaching with the starting chili pepper broach and passed this by hand and then broached up to 8.  With the 8 broach in place I chose a high offset neck and did several trial reductions.  The offset was appropriate, leg lengths  ? appeared to be equal best matched with the +5 head ball trial confirmed radiographically.  ? Given these findings, I went ahead and dislocated the hip, repositioned all  ? retractors and positioned the right hip in the extended and abducted position.  ?The final 8 Hi Actis stem was  ? chosen and it was impacted down to the level of neck cut.  Based on this  ? and the trial reductions, a final 36+5 delta ceramic ball was chosen and  ? impacted onto a clean and dry trunnion, and the hip was reduced.  The  ? hip had been irrigated throughout the case again at this point.  I did  ? reapproximate the superior capsular leaflet to the anterior leaflet  ? using #1 Vicryl.  The fascia of the  ? tensor fascia lata muscle was then reapproximated using #1 Vicryl and #0 Stratafix sutures.  The  ? remaining wound was closed with 2-0 Vicryl and running 4-0 Monocryl.  ? The hip  was cleaned, dried, and dressed sterilely using Dermabond and  ? Aquacel dressing.  The patient was then brought  ? to  recovery room in stable condition tolerating the procedure well.  ? ? Costella Hatcher, PA-C was present for the entirety of the case involved from  ? preoperative positioning, perioperative retractor management, general  ? facilitation of the case, as well as primary wound closure as assistant.  ?   ?   ?   ? Pietro Cassis Alvan Dame, M.D.  ?   ?   ?12/05/2021 12:39 PM  ?   ?   ?

## 2021-12-05 NOTE — Evaluation (Signed)
Physical Therapy Evaluation ?Patient Details ?Name: Joseph Whitaker ?MRN: [id number] ?DOB: 1945-11-29 ?Today's Date: 12/05/2021 ? ?History of Present Illness ? Pt is a 76 yo male presenting s/p L-THA, anterior approach on 12/05/21. PMH: OA, HTN, hyperlipidemia, hypothyroidism, R-TKA 2018  ?Clinical Impression ? Joseph Whitaker is a 76 y.o. male POD 0 s/p L-THA, anterior approach. Patient reports independence with mobility at baseline. Patient is now limited by functional impairments (see PT problem list below) and requires min assist for transfers. Ambulation was attempted but discontinued after pt reporting increased heat and sweatiness, assisted pt to recliner and pt reported decrease in symptoms, VSS throughout. Further mobility deferred. Patient instructed in exercise to facilitate ROM and circulation to manage edema. Patient will benefit from continued skilled PT interventions to address impairments and progress towards PLOF. Acute PT will follow to progress mobility and stair training in preparation for safe discharge home.   ?   ? ?Recommendations for follow up therapy are one component of a multi-disciplinary discharge planning process, led by the attending physician.  Recommendations may be updated based on patient status, additional functional criteria and insurance authorization. ? ?Follow Up Recommendations Follow physician's recommendations for discharge plan and follow up therapies ? ?  ?Assistance Recommended at Discharge Set up Supervision/Assistance  ?Patient can return home with the following ? A little help with walking and/or transfers;A little help with bathing/dressing/bathroom;Assistance with cooking/housework;Assist for transportation;Help with stairs or ramp for entrance ? ?  ?Equipment Recommendations None recommended by PT (pt has recommended DME)  ?Recommendations for Other Services ?    ?  ?Functional Status Assessment Patient has had a recent decline in their functional status and  demonstrates the ability to make significant improvements in function in a reasonable and predictable amount of time.  ? ?  ?Precautions / Restrictions Precautions ?Precautions: Fall ?Restrictions ?Weight Bearing Restrictions: No  ? ?  ? ?Mobility ? Bed Mobility ?Overal bed mobility: Needs Assistance ?Bed Mobility: Supine to Sit ?  ?  ?Supine to sit: Min assist ?  ?  ?General bed mobility comments: Pt required min assist to advance LLE off bed and scoot hips to EOB, otherwise min guard for safety only with VC for sequencing and hand placement. ?  ? ?Transfers ?Overall transfer level: Needs assistance ?Equipment used: Rolling walker (2 wheels) ?Transfers: Sit to/from Stand, Bed to chair/wheelchair/BSC ?Sit to Stand: Min guard ?  ?Step pivot transfers: Min guard ?  ?  ?  ?General transfer comment: Pt min guard for transfers for safety only, no physical assist required. Upon taking a few steps toward the door, pt reported feeling hot and sweaty; directed pt to take steps and sit in recliner. Elevated pt's feet and reclined back of recliner and took vitals (VSS, see vitals flowsheet). Upon ~60s of seated rest pt reporting decreased heat. Further mobility deferred ?  ? ?Ambulation/Gait ?  ?  ?  ?  ?  ?  ?  ?General Gait Details: deferred ? ?Stairs ?  ?  ?  ?  ?  ? ?Wheelchair Mobility ?  ? ?Modified Rankin (Stroke Patients Only) ?  ? ?  ? ?Balance Overall balance assessment: Needs assistance ?Sitting-balance support: Feet supported, No upper extremity supported ?Sitting balance-Leahy Scale: Fair ?  ?  ?Standing balance support: Bilateral upper extremity supported, During functional activity, Reliant on assistive device for balance ?Standing balance-Leahy Scale: Poor ?  ?  ?  ?  ?  ?  ?  ?  ?  ?  ?  ?  ?   ? ? ? ?  Pertinent Vitals/Pain Pain Assessment ?Pain Assessment: 0-10 ?Pain Score: 5  ?Pain Location: L hip ?Pain Descriptors / Indicators: Operative site guarding, Discomfort ?Pain Intervention(s): Monitored during  session, Repositioned, Ice applied  ? ? ?Home Living Family/patient expects to be discharged to:: Private residence ?Living Arrangements: Spouse/significant other ?Available Help at Discharge: Family;Available 24 hours/day ?Type of Home: House ?Home Access: Stairs to enter ?Entrance Stairs-Rails: Right ?Entrance Stairs-Number of Steps: 3 ?  ?Home Layout: One level ?Home Equipment: Rolling Walker (2 wheels);BSC/3in1 ?Additional Comments: Wife Dot and Daughter [name] present and provided some of home environment information  ?  ?Prior Function Prior Level of Function : Independent/Modified Independent;Working/employed;Driving ([employer]) ?  ?  ?  ?  ?  ?  ?Mobility Comments: IND ?ADLs Comments: IND ?  ? ? ?Hand Dominance  ? Dominant Hand: Right ? ?  ?Extremity/Trunk Assessment  ? Upper Extremity Assessment ?Upper Extremity Assessment: Overall WFL for tasks assessed ?  ? ?Lower Extremity Assessment ?Lower Extremity Assessment: RLE deficits/detail;LLE deficits/detail ?RLE Deficits / Details: MMT: ank df/pf 5/5 ?RLE Sensation: WNL ?LLE Deficits / Details: MMT: ank df/pf 5/5 ?  ? ?Cervical / Trunk Assessment ?Cervical / Trunk Assessment: Normal  ?Communication  ? Communication: No difficulties  ?Cognition Arousal/Alertness: Awake/alert ?Behavior During Therapy: [facility] for tasks assessed/performed ?Overall Cognitive Status: Within Functional Limits for tasks assessed ?  ?  ?  ?  ?  ?  ?  ?  ?  ?  ?  ?  ?  ?  ?  ?  ?  ?  ?  ? ?  ?General Comments   ? ?  ?Exercises Total Joint Exercises ?Ankle Circles/Pumps: AROM, Both, 20 reps ?Quad Sets: AROM, Left, 5 reps  ? ?Assessment/Plan  ?  ?PT Assessment Patient needs continued PT services  ?PT Problem List Decreased strength;Decreased range of motion;Decreased activity tolerance;Decreased balance;Decreased mobility;Decreased coordination;Decreased knowledge of use of DME;Pain ? ?   ?  ?PT Treatment Interventions DME instruction;Gait training;Stair training;Functional mobility  training;Therapeutic activities;Therapeutic exercise;Balance training;Neuromuscular re-education;Patient/family education   ? ?PT Goals (Current goals can be found in the Care Plan section)  ?Acute Rehab PT Goals ?Patient Stated Goal: To get back on the tractor ?PT Goal Formulation: With patient ?Time For Goal Achievement: 12/12/21 ?Potential to Achieve Goals: Good ? ?  ?Frequency 7X/week ?  ? ? ?Co-evaluation   ?  ?  ?  ?  ? ? ?  ?AM-PAC PT "6 Clicks" Mobility  ?Outcome Measure Help needed turning from your back to your side while in a flat bed without using bedrails?: A Little ?Help needed moving from lying on your back to sitting on the side of a flat bed without using bedrails?: A Little ?Help needed moving to and from a bed to a chair (including a wheelchair)?: A Little ?Help needed standing up from a chair using your arms (e.g., wheelchair or bedside chair)?: A Little ?Help needed to walk in hospital room?: A Little ?Help needed climbing 3-5 steps with a railing? : A Little ?6 Click Score: 18 ? ?  ?End of Session Equipment Utilized During Treatment: Gait belt ?Activity Tolerance: Patient tolerated treatment well ?Patient left: in chair;with call bell/phone within reach;with chair alarm set;with family/visitor present ?Nurse Communication: Mobility status ?PT Visit Diagnosis: Pain;Difficulty in walking, not elsewhere classified (R26.2) ?Pain - Right/Left: Left ?Pain - part of body: Hip ?  ? ?Time: 6440-3474 ?PT Time Calculation (min) (ACUTE ONLY): 27 min ? ? ?Charges:   PT Evaluation ?$PT Eval Low  Complexity: 1 Low ?PT Treatments ?$Therapeutic Activity: 8-22 mins ?  ?   ? ? ?Coolidge Breeze, PT, DPT ?[facility] Department ?Office: [phone] ?Pager: [phone] ? ?Coolidge Breeze ?12/05/2021, 6:14 PM ? ?

## 2021-12-05 NOTE — Anesthesia Postprocedure Evaluation (Signed)
Anesthesia Post Note ? ?Patient: Joseph Whitaker ? ?Procedure(s) Performed: TOTAL HIP ARTHROPLASTY ANTERIOR APPROACH (Left: Hip) ? ?  ? ?Patient location during evaluation: PACU ?Anesthesia Type: Spinal ?Level of consciousness: awake and alert ?Pain management: pain level controlled ?Vital Signs Assessment: post-procedure vital signs reviewed and stable ?Respiratory status: spontaneous breathing, nonlabored ventilation and respiratory function stable ?Cardiovascular status: blood pressure returned to baseline and stable ?Postop Assessment: no apparent nausea or vomiting ?Anesthetic complications: no ? ? ?No notable events documented. ? ?Last Vitals:  ?Vitals:  ? 12/05/21 1415 12/05/21 1427  ?BP: 122/74 131/76  ?Pulse: (!) 55 60  ?Resp: 14 14  ?Temp: 36.4 ?C (!) 36.4 ?C  ?SpO2: 97% 98%  ?  ?Last Pain:  ?Vitals:  ? 12/05/21 1427  ?TempSrc: Oral  ?PainSc:   ? ? ?LLE Motor Response: Purposeful movement (12/05/21 1415) ?  ?RLE Motor Response: Purposeful movement (12/05/21 1415) ?  ?L Sensory Level: S1-Sole of foot, small toes (12/05/21 1415) ?R Sensory Level: S1-Sole of foot, small toes (12/05/21 1415) ? ?Lynda Rainwater ? ? ? ? ?

## 2021-12-05 NOTE — Discharge Instructions (Signed)

## 2021-12-05 NOTE — Anesthesia Procedure Notes (Signed)
Spinal ? ?Patient location during procedure: OR ?Start time: 12/05/2021 11:27 AM ?End time: 12/05/2021 11:34 AM ?Reason for block: surgical anesthesia ?Staffing ?Performed: resident/CRNA  ?Resident/CRNA: Milford Cage, CRNA ?Preanesthetic Checklist ?Completed: patient identified, IV checked, site marked, risks and benefits discussed, surgical consent, monitors and equipment checked, pre-op evaluation and timeout performed ?Spinal Block ?Patient position: sitting ?Prep: DuraPrep and site prepped and draped ?Patient monitoring: heart rate, cardiac monitor, continuous pulse ox and blood pressure ?Approach: midline ?Location: L3-4 ?Injection technique: single-shot ?Needle ?Needle type: Pencan  ?Needle gauge: 24 G ?Needle length: 10 cm ?Assessment ?Sensory level: T6 ?Events: CSF return ?Additional Notes ?Functioning IV was confirmed and monitors were applied. Sterile prep and drape, including hand hygiene and sterile gloves were used. The patient was positioned and the spine was prepped. The skin was anesthetized with lidocaine.  Free flow of clear CSF was obtained on 2nd attempt prior to injecting local anesthetic into the CSF.  The spinal needle aspirated freely following injection.  The needle was carefully withdrawn.  The patient tolerated the procedure well.  ? ? ? ?

## 2021-12-06 ENCOUNTER — Encounter (HOSPITAL_COMMUNITY): Payer: Self-pay | Admitting: Orthopedic Surgery

## 2021-12-06 DIAGNOSIS — Z79899 Other long term (current) drug therapy: Secondary | ICD-10-CM | POA: Diagnosis not present

## 2021-12-06 DIAGNOSIS — M1612 Unilateral primary osteoarthritis, left hip: Secondary | ICD-10-CM | POA: Diagnosis not present

## 2021-12-06 DIAGNOSIS — Z859 Personal history of malignant neoplasm, unspecified: Secondary | ICD-10-CM | POA: Diagnosis not present

## 2021-12-06 DIAGNOSIS — Z96651 Presence of right artificial knee joint: Secondary | ICD-10-CM | POA: Diagnosis not present

## 2021-12-06 DIAGNOSIS — E039 Hypothyroidism, unspecified: Secondary | ICD-10-CM | POA: Diagnosis not present

## 2021-12-06 DIAGNOSIS — I10 Essential (primary) hypertension: Secondary | ICD-10-CM | POA: Diagnosis not present

## 2021-12-06 LAB — CBC
HCT: 32.3 % — ABNORMAL LOW (ref 39.0–52.0)
Hemoglobin: 10.9 g/dL — ABNORMAL LOW (ref 13.0–17.0)
MCH: 31.2 pg (ref 26.0–34.0)
MCHC: 33.7 g/dL (ref 30.0–36.0)
MCV: 92.6 fL (ref 80.0–100.0)
Platelets: 207 K/uL (ref 150–400)
RBC: 3.49 MIL/uL — ABNORMAL LOW (ref 4.22–5.81)
RDW: 12 % (ref 11.5–15.5)
WBC: 8.5 K/uL (ref 4.0–10.5)
nRBC: 0 % (ref 0.0–0.2)

## 2021-12-06 LAB — BASIC METABOLIC PANEL WITH GFR
Anion gap: 9 (ref 5–15)
BUN: 19 mg/dL (ref 8–23)
CO2: 22 mmol/L (ref 22–32)
Calcium: 8.6 mg/dL — ABNORMAL LOW (ref 8.9–10.3)
Chloride: 107 mmol/L (ref 98–111)
Creatinine, Ser: 1.03 mg/dL (ref 0.61–1.24)
GFR, Estimated: >60 mL/min
Glucose, Bld: 131 mg/dL — ABNORMAL HIGH (ref 70–99)
Potassium: 4 mmol/L (ref 3.5–5.1)
Sodium: 138 mmol/L (ref 135–145)

## 2021-12-06 MED ORDER — ASPIRIN 81 MG PO CHEW: 81.0000 mg | CHEWABLE_TABLET | Freq: Two times a day (BID) | ORAL | 0 refills | Status: AC

## 2021-12-06 MED ORDER — CELECOXIB 200 MG PO CAPS: 200.0000 mg | ORAL_CAPSULE | Freq: Two times a day (BID) | ORAL | 0 refills | Status: DC

## 2021-12-06 MED ORDER — POLYETHYLENE GLYCOL 3350 17 G PO PACK: 17.0000 g | PACK | Freq: Every day | ORAL | 0 refills | Status: DC | PRN

## 2021-12-06 MED ORDER — METHOCARBAMOL 500 MG PO TABS
500.0000 mg | ORAL_TABLET | Freq: Four times a day (QID) | ORAL | 0 refills | Status: DC | PRN
Start: 2021-12-06 — End: 2022-09-26

## 2021-12-06 MED ORDER — DOCUSATE SODIUM 100 MG PO CAPS
100.0000 mg | ORAL_CAPSULE | Freq: Two times a day (BID) | ORAL | 0 refills | Status: DC
Start: 2021-12-06 — End: 2022-09-26

## 2021-12-06 MED ORDER — HYDROCODONE-ACETAMINOPHEN 5-325 MG PO TABS: 1.0000 | ORAL_TABLET | Freq: Four times a day (QID) | ORAL | 0 refills | Status: DC | PRN

## 2021-12-06 NOTE — Progress Notes (Signed)
Physical Therapy Treatment ?Patient Details ?Name: Joseph Whitaker ?MRN: [id number] ?DOB: 04/22/1946 ?Today's Date: 12/06/2021 ? ? ?History of Present Illness Pt is a 76 yo male presenting s/p L-THA, anterior approach on 12/05/21. PMH: OA, HTN, hyperlipidemia, hypothyroidism, R-TKA 2018 ? ?  ?PT Comments  ? ? Pt seen POD1 s/p procedure above, reporting fatigue from lack of sleep and some cognitive fogginess he suspects from medications. Pt min assist for bed mobility and min guard for transfers, ambulation in hallway with RW, and min guard to supervision for stair training. Pt and family educated on safe and appropriate walking program with stepwise progression, verbalized understanding. Provided HEP and reviewed exercises with pt and answered questions, pt demonstrating safe form with cuing for breathing. Feel pt is progressing well and has met his therapy goals and is safe to discharge home with family assist from a therapy standpoint. Should he remain in acute care we will continue to follow him to promote modified independence with functional mobility. ?  ?Recommendations for follow up therapy are one component of a multi-disciplinary discharge planning process, led by the attending physician.  Recommendations may be updated based on patient status, additional functional criteria and insurance authorization. ? ?Follow Up Recommendations ? Follow physician's recommendations for discharge plan and follow up therapies ?  ?  ?Assistance Recommended at Discharge Set up Supervision/Assistance  ?Patient can return home with the following A little help with walking and/or transfers;A little help with bathing/dressing/bathroom;Assistance with cooking/housework;Assist for transportation;Help with stairs or ramp for entrance ?  ?Equipment Recommendations ? None recommended by PT (pt has recommended DME)  ?  ?Recommendations for Other Services   ? ? ?  ?Precautions / Restrictions Precautions ?Precautions:  Fall ?Restrictions ?Weight Bearing Restrictions: No  ?  ? ?Mobility ? Bed Mobility ?Overal bed mobility: Needs Assistance ?Bed Mobility: Supine to Sit ?  ?  ?Supine to sit: Min assist ?  ?  ?General bed mobility comments: Pt required min assist to advance LLE off bed and trunk assist to raise upright, otherwise min guard for safety only with VC for sequencing and hand placement. Upon sitting EOB pt reported some dizziness but with ~60s rest and pursed lip breathing pt reported symptoms resolved. ?  ? ?Transfers ?Overall transfer level: Needs assistance ?Equipment used: Rolling walker (2 wheels) ?Transfers: Sit to/from Stand, Bed to chair/wheelchair/BSC ?Sit to Stand: Min guard ?  ?  ?  ?  ?  ?General transfer comment: Pt min guard for transfers for safety only, no physical assist required. ?  ? ?Ambulation/Gait ?Ambulation/Gait assistance: Min guard ?Gait Distance (Feet): 150 Feet ?Assistive device: Rolling walker (2 wheels) ?Gait Pattern/deviations: Step-through pattern, Decreased stride length ?Gait velocity: decreased ?  ?  ?General Gait Details: Pt ambulated with min guard assist, no physical assist required or LOB noted. Demonstrated step-to pattern that progressed to step through with VCs for proximity to device and turning slowly. ? ? ?Stairs ?Stairs: Yes ?Stairs assistance: Min guard, Supervision ?Stair Management: One rail Right, Forwards, Step to pattern ?Number of Stairs: 3 ?General stair comments: Pt and family educated on safe stair ascent/descent using 1 rail and HHA, verbalized understanding. Pt demonstrated safe technique with step-to pattern with VCs for sequencing. Family member provided min guard assist during stair descent with supervision from PT. No overt LOB noted. ? ? ?Wheelchair Mobility ?  ? ?Modified Rankin (Stroke Patients Only) ?  ? ? ?  ?Balance Overall balance assessment: Needs assistance ?Sitting-balance support: Feet supported, No upper extremity supported ?  Sitting balance-Leahy  Scale: Fair ?  ?  ?Standing balance support: Bilateral upper extremity supported, During functional activity, Reliant on assistive device for balance ?Standing balance-Leahy Scale: Poor ?  ?  ?  ?  ?  ?  ?  ?  ?  ?  ?  ?  ?  ? ?  ?Cognition Arousal/Alertness: Awake/alert ?Behavior During Therapy: [facility] for tasks assessed/performed ?Overall Cognitive Status: Within Functional Limits for tasks assessed ?  ?  ?  ?  ?  ?  ?  ?  ?  ?  ?  ?  ?  ?  ?  ?  ?  ?  ?  ? ?  ?Exercises Total Joint Exercises ?Ankle Circles/Pumps: AROM, Both, 20 reps ?Quad Sets: AROM, Left, 5 reps ?Short Arc Quad: AROM, Left, 5 reps ?Heel Slides: AAROM, Left, 5 reps ?Hip ABduction/ADduction: AROM, Left, 5 reps ?Straight Leg Raises: AROM, Left, 5 reps ?Other Exercises ?Other Exercises: Educated pt and family on appropriate walking program at home, including progression to walking outside when safe and appropriate. ? ?  ?General Comments   ?  ?  ? ?Pertinent Vitals/Pain Pain Assessment ?Pain Assessment: 0-10 ?Pain Score: 3  ?Pain Location: L hip ?Pain Descriptors / Indicators: Operative site guarding, Discomfort ?Pain Intervention(s): Monitored during session, Repositioned, Ice applied  ? ? ?Home Living   ?  ?  ?  ?  ?  ?  ?  ?  ?  ?   ?  ?Prior Function    ?  ?  ?   ? ?PT Goals (current goals can now be found in the care plan section) Acute Rehab PT Goals ?Patient Stated Goal: To get back on the tractor ?PT Goal Formulation: With patient ?Time For Goal Achievement: 12/12/21 ?Potential to Achieve Goals: Good ?Progress towards PT goals: Progressing toward goals ? ?  ?Frequency ? ? ? 7X/week ? ? ? ?  ?PT Plan Current plan remains appropriate  ? ? ?Co-evaluation   ?  ?  ?  ?  ? ?  ?AM-PAC PT "6 Clicks" Mobility   ?Outcome Measure ? Help needed turning from your back to your side while in a flat bed without using bedrails?: A Little ?Help needed moving from lying on your back to sitting on the side of a flat bed without using bedrails?: A Little ?Help  needed moving to and from a bed to a chair (including a wheelchair)?: A Little ?Help needed standing up from a chair using your arms (e.g., wheelchair or bedside chair)?: A Little ?Help needed to walk in hospital room?: A Little ?Help needed climbing 3-5 steps with a railing? : A Little ?6 Click Score: 18 ? ?  ?End of Session Equipment Utilized During Treatment: Gait belt ?Activity Tolerance: Patient tolerated treatment well ?Patient left: in chair;with call bell/phone within reach;with chair alarm set;with family/visitor present ?Nurse Communication: Mobility status ?PT Visit Diagnosis: Pain;Difficulty in walking, not elsewhere classified (R26.2) ?Pain - Right/Left: Left ?Pain - part of body: Hip ?  ? ? ?Time: 3709-6438 ?PT Time Calculation (min) (ACUTE ONLY): 46 min ? ?Charges:  $Gait Training: 23-37 mins ?$Therapeutic Exercise: 8-22 mins          ?          ?Coolidge Breeze, PT, DPT ?[facility] Department ?Office: [phone] ?Pager: [phone] ? ? ?Coolidge Breeze ?12/06/2021, 10:33 AM ? ?

## 2021-12-06 NOTE — Progress Notes (Signed)
? ?  Subjective: ?1 Day Post-Op Procedure(s) (LRB): ?TOTAL HIP ARTHROPLASTY ANTERIOR APPROACH (Left) ?Patient reports pain as mild.   ?Patient seen in rounds by Dr. Alvan Dame. ?Patient is resting in bed on exam this morning. No acute events overnight. Ambulated a few steps with therapy, but per chart review became sweaty and had to rest.  ?We will continue therapy today.  ? ?Objective: ?Vital signs in last 24 hours: ?Temp:  [97.5 ?F (36.4 ?C)-98.6 ?F (37 ?C)] 98.1 ?F (36.7 ?C) (04/05 7510) ?Pulse Rate:  [54-79] 69 (04/05 0627) ?Resp:  [11-22] 17 (04/05 2585) ?BP: (105-151)/(63-86) 107/63 (04/05 2778) ?SpO2:  [93 %-100 %] 96 % (04/05 0627) ?Weight:  [77.1 kg] 77.1 kg (04/04 0938) ? ?Intake/Output from previous day: ? ?Intake/Output Summary (Last 24 hours) at 12/06/2021 0720 ?Last data filed at 12/06/2021 0640 ?Gross per 24 hour  ?Intake 3230.87 ml  ?Output 1600 ml  ?Net 1630.87 ml  ?  ? ?Intake/Output this shift: ?No intake/output data recorded. ? ?Labs: ?Recent Labs  ?  12/06/21 ?0301  ?HGB 10.9*  ? ?Recent Labs  ?  12/06/21 ?0301  ?WBC 8.5  ?RBC 3.49*  ?HCT 32.3*  ?PLT 207  ? ?Recent Labs  ?  12/06/21 ?0301  ?NA 138  ?K 4.0  ?CL 107  ?CO2 22  ?BUN 19  ?CREATININE 1.03  ?GLUCOSE 131*  ?CALCIUM 8.6*  ? ?No results for input(s): LABPT, INR in the last 72 hours. ? ?Exam: ?General - Patient is Alert and Oriented ?Extremity - Neurologically intact ?Sensation intact distally ?Intact pulses distally ?Dorsiflexion/Plantar flexion intact ?Dressing - dressing C/D/I ?Motor Function - intact, moving foot and toes well on exam.  ? ?Past Medical History:  ?Diagnosis Date  ? Arthritis   ? Cancer [facility])   ? Hyperlipidemia   ? Hypertension   ? Hypothyroidism   ? ? ?Assessment/Plan: ?1 Day Post-Op Procedure(s) (LRB): ?TOTAL HIP ARTHROPLASTY ANTERIOR APPROACH (Left) ?Principal Problem: ?  S/P total left hip arthroplasty ? ?Estimated body mass index is 25.1 kg/m? as calculated from the following: ?  Height as of this encounter: '5\' 9"'$  (1.753 m). ?   Weight as of this encounter: 77.1 kg. ?Advance diet ?Up with therapy ?D/C IV fluids ? ?DVT Prophylaxis - Aspirin ?Weight bearing as tolerated. ? ?Hgb stable at 10.9 this AM. ? ?Plan is to go Home after hospital stay. Plan for discharge today after meeting goals with therapy. Follow up in the office in 2 weeks.  ? ?Griffith Citron, PA-C ?Orthopedic Surgery ?[phone] ?12/06/2021, 7:20 AM  ?

## 2021-12-06 NOTE — TOC Transition Note (Signed)
Transition of Care (TOC) - CM/SW Discharge Note ? ? ?Patient Details  ?Name: Joseph Whitaker ?MRN: [id number] ?Date of Birth: 21-Oct-1945 ? ?Transition of Care (TOC) CM/SW Contact:  ?Tekeya Geffert, LCSW ?Phone Number: ?12/06/2021, 1:01 PM ? ? ?Clinical Narrative:    ? ?Met briefly with pt and spouse and confirm he has all needed DME at home.  Plan for HEP.  No TOC needs. ? ?Final next level of care: Home/Self Care ?Barriers to Discharge: No Barriers Identified ? ? ?Patient Goals and CMS Choice ?Patient states their goals for this hospitalization and ongoing recovery are:: return home ?  ?  ? ?Discharge Placement ?  ?           ?  ?  ?  ?  ? ?Discharge Plan and Services ?  ?  ?           ?DME Arranged: N/A ?DME Agency: NA ?  ?  ?  ?  ?  ?  ?  ?  ? ?Social Determinants of Health (SDOH) Interventions ?  ? ? ?Readmission Risk Interventions ?   ? View : No data to display.  ?  ?  ?  ? ? ? ? ? ?

## 2021-12-06 NOTE — Progress Notes (Signed)
Patient discharged to home w/ family. Given all belongings, instructions, equipment. Verbalized understanding of instructions. Escorted to pov via w/c. 

## 2021-12-14 NOTE — Discharge Summary (Signed)
Patient ID: ?Mar Daring ?MRN: [id number] ?DOB/AGE: 75-16-1947 75 y.o. ? ?Admit date: 12/05/2021 ?Discharge date: 12/06/2021 ? ?Admission Diagnoses:  ?Left hip osteoarthritis ? ?Discharge Diagnoses:  ?Principal Problem: ?  S/P total left hip arthroplasty ? ? ?Past Medical History:  ?Diagnosis Date  ? Arthritis   ? Cancer [facility])   ? Hyperlipidemia   ? Hypertension   ? Hypothyroidism   ? ? ?Surgeries: Procedure(s): ?TOTAL HIP ARTHROPLASTY ANTERIOR APPROACH on 12/05/2021 ?  ?Consultants:  ? ?Discharged Condition: Improved ? ?Hospital Course: Joseph Whitaker is an 76 y.o. male who was admitted 12/05/2021 for operative treatment ofS/P total left hip arthroplasty. Patient has severe unremitting pain that affects sleep, daily activities, and work/hobbies. After pre-op clearance the patient was taken to the operating room on 12/05/2021 and underwent  Procedure(s): ?TOTAL HIP ARTHROPLASTY ANTERIOR APPROACH.   ? ?Patient was given perioperative antibiotics:  ?Anti-infectives (From admission, onward)  ? ? Start     Dose/Rate Route Frequency Ordered Stop  ? 12/05/21 1800  ceFAZolin (ANCEF) IVPB 2g/100 mL premix       ? 2 g ?200 mL/hr over 30 Minutes Intravenous Every 6 hours 12/05/21 1425 12/05/21 2359  ? 12/05/21 0900  ceFAZolin (ANCEF) IVPB 2g/100 mL premix       ? 2 g ?200 mL/hr over 30 Minutes Intravenous On call to O.R. 12/05/21 0855 12/05/21 1135  ? ?  ?  ? ?Patient was given sequential compression devices, early ambulation, and chemoprophylaxis to prevent DVT. Patient worked with PT and was meeting their goals regarding safe ambulation and transfers. ? ?Patient benefited maximally from hospital stay and there were no complications.   ? ?Recent vital signs: No data found.  ? ?Recent laboratory studies: No results for input(s): WBC, HGB, HCT, PLT, NA, K, CL, CO2, BUN, CREATININE, GLUCOSE, INR, CALCIUM in the last 72 hours. ? ?Invalid input(s): PT, 2 ? ? ?Discharge Medications:   ?Allergies as of 12/06/2021   ? ?   Reactions  ? Red  Yeast Rice [cholestin] Other (See Comments)  ? Dizziness  ? Statins Other (See Comments)  ? Weight loss, malaise.  ? ?  ? ?  ?Medication List  ?  ? ?STOP taking these medications   ? ?tamsulosin 0.4 MG Caps capsule ?Commonly known as: FLOMAX ?  ? ?  ? ?TAKE these medications   ? ?amLODipine 5 MG tablet ?Commonly known as: NORVASC ?Take 1 tablet (5 mg total) by mouth daily. ?  ?aspirin 81 MG chewable tablet ?Chew 1 tablet (81 mg total) by mouth 2 (two) times daily for 28 days. ?  ?celecoxib 200 MG capsule ?Commonly known as: CELEBREX ?Take 1 capsule (200 mg total) by mouth 2 (two) times daily. ?  ?CHOLESTEROL SUPPORT PO ?Take 1 tablet by mouth 2 (two) times daily. Chlor Support ?  ?docusate sodium 100 MG capsule ?Commonly known as: COLACE ?Take 1 capsule (100 mg total) by mouth 2 (two) times daily. ?  ?HYDROcodone-acetaminophen 5-325 MG tablet ?Commonly known as: NORCO/VICODIN ?Take 1-2 tablets by mouth every 6 (six) hours as needed for severe pain. ?  ?levothyroxine 50 MCG tablet ?Commonly known as: SYNTHROID ?Take 1 tablet (50 mcg total) by mouth daily before breakfast. ?  ?methocarbamol 500 MG tablet ?Commonly known as: ROBAXIN ?Take 1 tablet (500 mg total) by mouth every 6 (six) hours as needed for muscle spasms. ?  ?polyethylene glycol 17 g packet ?Commonly known as: MIRALAX / GLYCOLAX ?Take 17 g by mouth daily as needed for  mild constipation. ?  ? ?  ? ?  ?  ? ? ?  ?Discharge Care Instructions  ?(From admission, onward)  ?  ? ? ?  ? ?  Start     Ordered  ? 12/06/21 0000  Change dressing       ?Comments: Maintain surgical dressing until follow up in the clinic. If the edges start to pull up, may reinforce with tape. If the dressing is no longer working, may remove and cover with gauze and tape, but must keep the area dry and clean.  Call with any questions or concerns.  ? 12/06/21 0723  ? ?  ?  ? ?  ? ? ?Diagnostic Studies: DG Pelvis Portable ? ?Result Date: 12/05/2021 ?CLINICAL DATA:  Left hip arthroplasty EXAM:  PORTABLE PELVIS 1-2 VIEWS COMPARISON:  None. FINDINGS: Postsurgical changes from left total hip arthroplasty. Arthroplasty components are in their expected alignment. No periprosthetic fracture or evidence of other complication. Expected postoperative changes within the overlying soft tissues. IMPRESSION: Negative. Electronically Signed   By: Davina Poke D.O.   On: 12/05/2021 13:24  ? ?DG C-Arm 1-60 Min-No Report ? ?Result Date: 12/05/2021 ?CLINICAL DATA:  Right hip surgery EXAM: DG HIP (WITH OR WITHOUT PELVIS) 1V PORT LEFT; DG C-ARM 1-60 MIN-NO REPORT COMPARISON:  None. FINDINGS: 4 C-arm fluoroscopic images were obtained intraoperatively and submitted for post operative interpretation. Images demonstrate placement of left total hip arthroplasty hardware. 11 seconds fluoroscopy time utilized. Dose: 1.44 mGy. Please see the performing provider's procedural report for further detail. IMPRESSION: As above. Electronically Signed   By: Davina Poke D.O.   On: 12/05/2021 12:51  ? ?DG HIP PORT UNILAT WITH PELVIS 1V LEFT ? ?Result Date: 12/05/2021 ?CLINICAL DATA:  Right hip surgery EXAM: DG HIP (WITH OR WITHOUT PELVIS) 1V PORT LEFT; DG C-ARM 1-60 MIN-NO REPORT COMPARISON:  None. FINDINGS: 4 C-arm fluoroscopic images were obtained intraoperatively and submitted for post operative interpretation. Images demonstrate placement of left total hip arthroplasty hardware. 11 seconds fluoroscopy time utilized. Dose: 1.44 mGy. Please see the performing provider's procedural report for further detail. IMPRESSION: As above. Electronically Signed   By: Davina Poke D.O.   On: 12/05/2021 12:51   ? ?Disposition: Discharge disposition: 01-Home or Self Care ? ? ? ? ? ? ?Discharge Instructions   ? ? Call MD / Call 911   Complete by: As directed ?  ? If you experience chest pain or shortness of breath, CALL 911 and be transported to the hospital emergency room.  If you develope a fever above 101 F, pus (white drainage) or increased  drainage or redness at the wound, or calf pain, call your surgeon's office.  ? Change dressing   Complete by: As directed ?  ? Maintain surgical dressing until follow up in the clinic. If the edges start to pull up, may reinforce with tape. If the dressing is no longer working, may remove and cover with gauze and tape, but must keep the area dry and clean.  Call with any questions or concerns.  ? Constipation Prevention   Complete by: As directed ?  ? Drink plenty of fluids.  Prune juice may be helpful.  You may use a stool softener, such as Colace (over the counter) 100 mg twice a day.  Use MiraLax (over the counter) for constipation as needed.  ? Diet - low sodium heart healthy   Complete by: As directed ?  ? Increase activity slowly as tolerated   Complete  by: As directed ?  ? Weight bearing as tolerated with assist device (walker, cane, etc) as directed, use it as long as suggested by your surgeon or therapist, typically at least 4-6 weeks.  ? Post-operative opioid taper instructions:   Complete by: As directed ?  ? POST-OPERATIVE OPIOID TAPER INSTRUCTIONS: ?It is important to wean off of your opioid medication as soon as possible. If you do not need pain medication after your surgery it is ok to stop day one. ?Opioids include: ?Codeine, Hydrocodone(Norco, Vicodin), Oxycodone(Percocet, oxycontin) and hydromorphone amongst others.  ?Long term and even short term use of opiods can cause: ?Increased pain response ?Dependence ?Constipation ?Depression ?Respiratory depression ?And more.  ?Withdrawal symptoms can include ?Flu like symptoms ?Nausea, vomiting ?And more ?Techniques to manage these symptoms ?Hydrate well ?Eat regular healthy meals ?Stay active ?Use relaxation techniques(deep breathing, meditating, yoga) ?Do Not substitute Alcohol to help with tapering ?If you have been on opioids for less than two weeks and do not have pain than it is ok to stop all together.  ?Plan to wean off of opioids ?This plan  should start within one week post op of your joint replacement. ?Maintain the same interval or time between taking each dose and first decrease the dose.  ?Cut the total daily intake of opioids by one tablet e

## 2021-12-15 ENCOUNTER — Ambulatory Visit: Payer: Medicare Other | Admitting: Physician Assistant

## 2021-12-25 LAB — SPECIMEN STATUS REPORT

## 2021-12-25 LAB — IRON: Iron: 101 ug/dL (ref 38–169)

## 2021-12-25 LAB — VITAMIN B12: Vitamin B-12: 978 pg/mL (ref 232–1245)

## 2021-12-25 LAB — FOLATE: Folate: >20 ng/mL (ref >3.0)

## 2022-01-17 DIAGNOSIS — Z4789 Encounter for other orthopedic aftercare: Secondary | ICD-10-CM | POA: Diagnosis not present

## 2022-01-23 DIAGNOSIS — L821 Other seborrheic keratosis: Secondary | ICD-10-CM | POA: Diagnosis not present

## 2022-01-23 DIAGNOSIS — D225 Melanocytic nevi of trunk: Secondary | ICD-10-CM | POA: Diagnosis not present

## 2022-01-23 DIAGNOSIS — D2262 Melanocytic nevi of left upper limb, including shoulder: Secondary | ICD-10-CM | POA: Diagnosis not present

## 2022-01-23 DIAGNOSIS — Z85820 Personal history of malignant melanoma of skin: Secondary | ICD-10-CM | POA: Diagnosis not present

## 2022-01-23 DIAGNOSIS — L570 Actinic keratosis: Secondary | ICD-10-CM | POA: Diagnosis not present

## 2022-01-23 DIAGNOSIS — Z85828 Personal history of other malignant neoplasm of skin: Secondary | ICD-10-CM | POA: Diagnosis not present

## 2022-05-14 ENCOUNTER — Other Ambulatory Visit: Payer: Self-pay | Admitting: Physician Assistant

## 2022-05-14 DIAGNOSIS — I10 Essential (primary) hypertension: Secondary | ICD-10-CM

## 2022-06-27 ENCOUNTER — Emergency Department
Admission: EM | Admit: 2022-06-27 | Discharge: 2022-06-27 | Disposition: A | Discharge status: Home / Self Care | Payer: No Typology Code available for payment source | Attending: Emergency Medicine | Admitting: Emergency Medicine

## 2022-06-27 ENCOUNTER — Other Ambulatory Visit: Payer: Self-pay

## 2022-06-27 ENCOUNTER — Emergency Department: Payer: No Typology Code available for payment source

## 2022-06-27 ENCOUNTER — Encounter: Payer: Self-pay | Admitting: Emergency Medicine

## 2022-06-27 DIAGNOSIS — S51012A Laceration without foreign body of left elbow, initial encounter: Secondary | ICD-10-CM | POA: Diagnosis not present

## 2022-06-27 DIAGNOSIS — I10 Essential (primary) hypertension: Secondary | ICD-10-CM | POA: Insufficient documentation

## 2022-06-27 DIAGNOSIS — M4312 Spondylolisthesis, cervical region: Secondary | ICD-10-CM | POA: Diagnosis not present

## 2022-06-27 DIAGNOSIS — E039 Hypothyroidism, unspecified: Secondary | ICD-10-CM | POA: Insufficient documentation

## 2022-06-27 DIAGNOSIS — S59902A Unspecified injury of left elbow, initial encounter: Secondary | ICD-10-CM | POA: Diagnosis not present

## 2022-06-27 DIAGNOSIS — Y92410 Unspecified street and highway as the place of occurrence of the external cause: Secondary | ICD-10-CM | POA: Insufficient documentation

## 2022-06-27 DIAGNOSIS — S0990XA Unspecified injury of head, initial encounter: Secondary | ICD-10-CM | POA: Diagnosis not present

## 2022-06-27 DIAGNOSIS — S199XXA Unspecified injury of neck, initial encounter: Secondary | ICD-10-CM | POA: Diagnosis not present

## 2022-06-27 DIAGNOSIS — S0083XA Contusion of other part of head, initial encounter: Secondary | ICD-10-CM | POA: Insufficient documentation

## 2022-06-27 MED ORDER — CEPHALEXIN 500 MG PO CAPS: 500.0000 mg | ORAL_CAPSULE | Freq: Three times a day (TID) | ORAL | 0 refills | Status: AC

## 2022-06-27 MED ORDER — LIDOCAINE-EPINEPHRINE-TETRACAINE (LET) SOLUTION
3.0000 mL | Freq: Once | NASAL | Status: AC
  Administered 2022-06-27: 3 mL via TOPICAL
  Filled 2022-06-27: qty 3
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
## 2022-06-27 NOTE — Discharge Instructions (Addendum)
Your CT scan of the head and neck are normal and reassuring.  X-ray of the elbow is also negative.  You have an open wound that will be treated with daily wet-to-dry wound care.  You may have some small retained pieces of glass within the wound, that may be seen over the next few days.  Follow-up with your primary provider for ongoing wound care.  Take over-the-counter Tylenol Motrin for pain.
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
## 2022-07-05 ENCOUNTER — Ambulatory Visit (INDEPENDENT_AMBULATORY_CARE_PROVIDER_SITE_OTHER): Payer: Medicare Other | Admitting: Physician Assistant

## 2022-07-05 ENCOUNTER — Encounter: Payer: Self-pay | Admitting: Physician Assistant

## 2022-07-05 VITALS — BP 146/96 | HR 65 | Ht 69.0 in | Wt 181.0 lb

## 2022-07-05 DIAGNOSIS — S41112A Laceration without foreign body of left upper arm, initial encounter: Secondary | ICD-10-CM

## 2022-09-25 NOTE — Progress Notes (Unsigned)
I,Sha'taria Whitaker,acting as a Education administrator for [employer], PA-C.,have documented all relevant documentation on the behalf of Joseph Kirschner, PA-C,as directed by  Joseph Kirschner, PA-C while in the presence of Joseph Kirschner, PA-C.  Annual Wellness Visit     Patient: Joseph Whitaker, Male    DOB: 03-03-1946, 77 y.o.   MRN: [id number] Visit Date: 09/26/2022  Today's Provider: [name], PA-C   Cc. awv  Subjective    Joseph Whitaker is a 77 y.o. male who presents today for his Annual Wellness Visit. He reports consuming a general diet. The patient has a physically strenuous job, but has no regular exercise apart from work.  He generally feels well. He reports sleeping well. He does not have additional problems to discuss today.    Medications: Outpatient Medications Prior to Visit  Medication Sig   amLODipine (NORVASC) 5 MG tablet TAKE 1 TABLET(5 MG) BY MOUTH DAILY   levothyroxine (SYNTHROID) 50 MCG tablet Take 1 tablet (50 mcg total) by mouth daily before breakfast.   Misc Natural Products (CHOLESTEROL SUPPORT PO) Take 1 tablet by mouth 2 (two) times daily. Chlor Support   tamsulosin (FLOMAX) 0.4 MG CAPS capsule Take by mouth daily.   [DISCONTINUED] celecoxib (CELEBREX) 200 MG capsule Take 1 capsule (200 mg total) by mouth 2 (two) times daily. (Patient not taking: Reported on 09/26/2022)   [DISCONTINUED] docusate sodium (COLACE) 100 MG capsule Take 1 capsule (100 mg total) by mouth 2 (two) times daily. (Patient not taking: Reported on 09/26/2022)   [DISCONTINUED] HYDROcodone-acetaminophen (NORCO/VICODIN) 5-325 MG tablet Take 1-2 tablets by mouth every 6 (six) hours as needed for severe pain. (Patient not taking: Reported on 07/05/2022)   [DISCONTINUED] methocarbamol (ROBAXIN) 500 MG tablet Take 1 tablet (500 mg total) by mouth every 6 (six) hours as needed for muscle spasms. (Patient not taking: Reported on 07/05/2022)   [DISCONTINUED] polyethylene glycol (MIRALAX / GLYCOLAX) 17 g  packet Take 17 g by mouth daily as needed for mild constipation. (Patient not taking: Reported on 09/26/2022)   No facility-administered medications prior to visit.    Allergies  Allergen Reactions   Red Yeast Rice [Cholestin] Other (See Comments)    Dizziness   Statins Other (See Comments)    Weight loss, malaise.    Patient Care Team: Joseph Kirschner, PA-C as PCP - General (Physician Assistant)  Review of Systems  Constitutional:  Negative for fatigue and fever.  Respiratory:  Negative for cough and shortness of breath.   Cardiovascular:  Negative for chest pain, palpitations and leg swelling.  Genitourinary:  Positive for frequency.  Neurological:  Negative for dizziness and headaches.       Objective    Blood pressure 139/71, pulse 74, height '5\' 9"'$  (1.753 m), weight 178 lb 3.2 oz (80.8 kg), SpO2 98 %.    Physical Exam Constitutional:      General: He is awake.     Appearance: He is well-developed.  HENT:     Head: Normocephalic.     Right Ear: Tympanic membrane, ear canal and external ear normal.     Left Ear: Tympanic membrane, ear canal and external ear normal.     Nose: Nose normal. No congestion or rhinorrhea.     Mouth/Throat:     Mouth: Mucous membranes are moist.     Pharynx: No oropharyngeal exudate or posterior oropharyngeal erythema.  Eyes:     Pupils: Pupils are equal, round, and reactive to light.  Cardiovascular:  Rate and Rhythm: Normal rate and regular rhythm.     Heart sounds: Normal heart sounds.  Pulmonary:     Effort: Pulmonary effort is normal.     Breath sounds: Normal breath sounds.  Abdominal:     General: There is no distension.     Palpations: Abdomen is soft.     Tenderness: There is no abdominal tenderness. There is no guarding.  Musculoskeletal:     Cervical back: Normal range of motion.     Right lower leg: No edema.     Left lower leg: No edema.  Lymphadenopathy:     Cervical: No cervical adenopathy.  Skin:    General:  Skin is warm.  Neurological:     Mental Status: He is alert and oriented to person, place, and time.  Psychiatric:        Attention and Perception: Attention normal.        Mood and Affect: Mood normal.        Speech: Speech normal.        Behavior: Behavior normal. Behavior is cooperative.     Most recent functional status assessment:    09/26/2022    8:46 AM  In your present state of health, do you have any difficulty performing the following activities:  Hearing? 0  Vision? 0  Difficulty concentrating or making decisions? 0  Walking or climbing stairs? 0  Dressing or bathing? 0  Doing errands, shopping? 0   Most recent fall risk assessment:    09/26/2022    8:46 AM  Fall Risk   Falls in the past year? 0  Number falls in past yr: 0  Injury with Fall? 0  Risk for fall due to : No Fall Risks  Follow up Falls evaluation completed    Most recent depression screenings:    09/26/2022    8:46 AM 09/18/2021    2:22 PM  PHQ 2/9 Scores  PHQ - 2 Score 0 0  PHQ- 9 Score 0 0   Most recent cognitive screening:     No data to display         Most recent Audit-C alcohol use screening    09/18/2021    2:22 PM  Alcohol Use Disorder Test (AUDIT)  1. How often do you have a drink containing alcohol? 0  2. How many drinks containing alcohol do you have on a typical day when you are drinking? 0  3. How often do you have six or more drinks on one occasion? 0  AUDIT-C Score 0   A score of 3 or more in women, and 4 or more in men indicates increased risk for alcohol abuse, EXCEPT if all of the points are from question 1   No results found for any visits on 09/26/22.  Assessment & Plan     Annual wellness visit done today including the all of the following: Reviewed patient's Family Medical History Reviewed and updated list of patient's medical providers Assessment of cognitive impairment was done Assessed patient's functional ability Established a written schedule for  health screening [place] Completed and Reviewed  Exercise Activities and Dietary recommendations --balanced diet high in fiber and protein, low in sugars, carbs, fats. --physical activity/exercise 30 minutes 3-5 times a week     Immunization History  Administered Date(s) Administered   Fluad Quad(high Dose 65+) 08/10/2019   Influenza Split 08/11/2009, 08/14/2010, 08/17/2011   Influenza, High Dose Seasonal PF 08/06/2016, 09/09/2017   Tdap 04/04/2009  Zoster, Live 07/02/2008    Health Maintenance  Topic Date Due   COVID-19 Vaccine (1) Never done   Zoster Vaccines- Shingrix (1 of 2) Never done   Pneumonia Vaccine 88+ Years old (1 - PCV) Never done   DTaP/Tdap/Td (2 - Td or Tdap) 04/05/2019   COLONOSCOPY (Pts 45-33yr Insurance coverage will need to be confirmed)  11/02/2022 (Originally 11/21/2020)   INFLUENZA VACCINE  12/02/2022 (Originally 04/03/2022)   Medicare Annual Wellness (AWV)  09/27/2023   Hepatitis C Screening  Completed   HPV VACCINES  Aged Out     Discussed health benefits of physical activity, and encouraged him to engage in regular exercise appropriate for his age and condition.    Problem List Items Addressed This Visit       Cardiovascular and Mediastinum   Essential (primary) hypertension    Chronic, well controlled Managed on amlodipine 5 mg  Ordered cmp F/u 6 mo       Relevant Orders   Comprehensive Metabolic Panel (CMET)     Endocrine   Hypothyroidism    Will repeat tsh/t4 Managed on levothyroxine 50 mg        Relevant Orders   TSH + free T4     Other   Benign prostatic hyperplasia with urinary frequency    Pt is 76, no need to check psa unless change in symptoms Stable, continue flomax       Relevant Medications   tamsulosin (FLOMAX) 0.4 MG CAPS capsule   Anemia    Had improved, last labs showing decrease 2/2 surgery Will repeat cbc      Relevant Orders   CBC w/Diff/Platelet   Other Visit Diagnoses      Encounter for annual wellness exam in Medicare patient    -  Primary   Hyperglycemia       Relevant Orders   HgB A1c      Pt declined all vaccines including pneumonia.  Pt declines to follow up with GI for colonoscopy, declines cologuard.   Return in about 6 months (around 03/27/2023) for chronic conditions, hypertension.     I, LMikey Kirschner PA-C have reviewed all documentation for this visit. The documentation on  09/26/22  for the exam, diagnosis, procedures, and orders are all accurate and complete.  LMikey Kirschner PA-C [facility] [place] [address]Office: [phone]: [phone]

## 2022-09-26 ENCOUNTER — Ambulatory Visit (INDEPENDENT_AMBULATORY_CARE_PROVIDER_SITE_OTHER): Payer: Medicare Other | Admitting: Physician Assistant

## 2022-09-26 ENCOUNTER — Encounter: Payer: Self-pay | Admitting: Physician Assistant

## 2022-09-26 VITALS — BP 139/71 | HR 74 | Ht 69.0 in | Wt 178.2 lb

## 2022-09-26 DIAGNOSIS — I10 Essential (primary) hypertension: Secondary | ICD-10-CM

## 2022-09-26 DIAGNOSIS — E039 Hypothyroidism, unspecified: Secondary | ICD-10-CM

## 2022-09-26 DIAGNOSIS — D649 Anemia, unspecified: Secondary | ICD-10-CM

## 2022-09-26 DIAGNOSIS — Z0000 Encounter for general adult medical examination without abnormal findings: Secondary | ICD-10-CM

## 2022-09-26 DIAGNOSIS — E038 Other specified hypothyroidism: Secondary | ICD-10-CM | POA: Diagnosis not present

## 2022-09-26 DIAGNOSIS — R739 Hyperglycemia, unspecified: Secondary | ICD-10-CM

## 2022-09-26 DIAGNOSIS — N401 Enlarged prostate with lower urinary tract symptoms: Secondary | ICD-10-CM

## 2022-09-26 NOTE — Assessment & Plan Note (Signed)
Will repeat tsh/t4 Managed on levothyroxine 50 mg

## 2022-09-26 NOTE — Assessment & Plan Note (Signed)
Pt is 76, no need to check psa unless change in symptoms Stable, continue flomax

## 2022-09-26 NOTE — Assessment & Plan Note (Signed)
Had improved, last labs showing decrease 2/2 surgery Will repeat cbc

## 2022-09-26 NOTE — Assessment & Plan Note (Signed)
Chronic, well controlled Managed on amlodipine 5 mg  Ordered cmp F/u 6 mo

## 2022-09-27 ENCOUNTER — Ambulatory Visit: Payer: Self-pay | Admitting: *Deleted

## 2022-09-27 LAB — CBC WITH DIFFERENTIAL/PLATELET
Basophils Absolute: 0 10*3/uL (ref 0.0–0.2)
Basos: 0 %
EOS (ABSOLUTE): 0.1 10*3/uL (ref 0.0–0.4)
Eos: 3 %
Hematocrit: 39.1 % (ref 37.5–51.0)
Hemoglobin: 13.5 g/dL (ref 13.0–17.7)
Immature Grans (Abs): 0 10*3/uL (ref 0.0–0.1)
Immature Granulocytes: 0 %
Lymphocytes Absolute: 1.5 10*3/uL (ref 0.7–3.1)
Lymphs: 37 %
MCH: 31.4 pg (ref 26.6–33.0)
MCHC: 34.5 g/dL (ref 31.5–35.7)
MCV: 91 fL (ref 79–97)
Monocytes Absolute: 0.5 10*3/uL (ref 0.1–0.9)
Monocytes: 12 %
Neutrophils Absolute: 1.9 10*3/uL (ref 1.4–7.0)
Neutrophils: 48 %
Platelets: 281 10*3/uL (ref 150–450)
RBC: 4.3 x10E6/uL (ref 4.14–5.80)
RDW: 12.3 % (ref 11.6–15.4)
WBC: 4 10*3/uL (ref 3.4–10.8)

## 2022-09-27 LAB — COMPREHENSIVE METABOLIC PANEL
ALT: 16 IU/L (ref 0–44)
AST: 22 IU/L (ref 0–40)
Albumin/Globulin Ratio: 2.3 — ABNORMAL HIGH (ref 1.2–2.2)
Albumin: 4.6 g/dL (ref 3.8–4.8)
Alkaline Phosphatase: 67 IU/L (ref 44–121)
BUN/Creatinine Ratio: 14 (ref 10–24)
BUN: 16 mg/dL (ref 8–27)
Bilirubin Total: 0.5 mg/dL (ref 0.0–1.2)
CO2: 22 mmol/L (ref 20–29)
Calcium: 9.5 mg/dL (ref 8.6–10.2)
Chloride: 105 mmol/L (ref 96–106)
Creatinine, Ser: 1.16 mg/dL (ref 0.76–1.27)
Globulin, Total: 2 g/dL (ref 1.5–4.5)
Glucose: 95 mg/dL (ref 70–99)
Potassium: 4.8 mmol/L (ref 3.5–5.2)
Sodium: 143 mmol/L (ref 134–144)
Total Protein: 6.6 g/dL (ref 6.0–8.5)
eGFR: 66 mL/min/{1.73_m2} (ref >59)

## 2022-09-27 LAB — TSH+FREE T4
Free T4: 1.08 ng/dL (ref 0.82–1.77)
TSH: 4.67 u[IU]/mL — ABNORMAL HIGH (ref 0.450–4.500)

## 2022-09-27 LAB — HEMOGLOBIN A1C
Est. average glucose Bld gHb Est-mCnc: 117 mg/dL
Hgb A1c MFr Bld: 5.7 % — ABNORMAL HIGH (ref 4.8–5.6)

## 2022-09-27 NOTE — Telephone Encounter (Signed)
Pt given lab results per notes of L. Drubel,PA  on 09/27/22. Pt verbalized understanding labs stable and is pre- diabetic recommend watching carb and sugar intake. Thyroid slightly elevated and recheck in 6 months no changes to medications. Patient requesting if cholesterol levels were checked. No labs noted for lipid panel. Patient requesting when labs rechecked in 6 months to check cholesterol as well.

## 2022-09-28 DIAGNOSIS — L300 Nummular dermatitis: Secondary | ICD-10-CM | POA: Diagnosis not present

## 2022-09-28 DIAGNOSIS — Z08 Encounter for follow-up examination after completed treatment for malignant neoplasm: Secondary | ICD-10-CM | POA: Diagnosis not present

## 2022-09-28 DIAGNOSIS — Z85828 Personal history of other malignant neoplasm of skin: Secondary | ICD-10-CM | POA: Diagnosis not present

## 2022-09-28 DIAGNOSIS — L570 Actinic keratosis: Secondary | ICD-10-CM | POA: Diagnosis not present

## 2022-09-28 DIAGNOSIS — Z86006 Personal history of melanoma in-situ: Secondary | ICD-10-CM | POA: Diagnosis not present

## 2022-09-28 DIAGNOSIS — D2261 Melanocytic nevi of right upper limb, including shoulder: Secondary | ICD-10-CM | POA: Diagnosis not present

## 2022-09-28 DIAGNOSIS — D2271 Melanocytic nevi of right lower limb, including hip: Secondary | ICD-10-CM | POA: Diagnosis not present

## 2022-09-28 DIAGNOSIS — Z85820 Personal history of malignant melanoma of skin: Secondary | ICD-10-CM | POA: Diagnosis not present

## 2022-10-31 ENCOUNTER — Other Ambulatory Visit: Payer: Self-pay | Admitting: Physician Assistant

## 2022-10-31 DIAGNOSIS — I10 Essential (primary) hypertension: Secondary | ICD-10-CM

## 2022-11-04 ENCOUNTER — Other Ambulatory Visit: Payer: Self-pay | Admitting: Physician Assistant

## 2022-11-04 DIAGNOSIS — N401 Enlarged prostate with lower urinary tract symptoms: Secondary | ICD-10-CM

## 2022-11-04 DIAGNOSIS — E039 Hypothyroidism, unspecified: Secondary | ICD-10-CM

## 2022-11-08 ENCOUNTER — Other Ambulatory Visit: Payer: Self-pay | Admitting: Physician Assistant

## 2022-11-08 DIAGNOSIS — E039 Hypothyroidism, unspecified: Secondary | ICD-10-CM

## 2022-11-13 DIAGNOSIS — H25811 Combined forms of age-related cataract, right eye: Secondary | ICD-10-CM | POA: Diagnosis not present

## 2022-11-13 DIAGNOSIS — H25812 Combined forms of age-related cataract, left eye: Secondary | ICD-10-CM | POA: Diagnosis not present

## 2022-11-13 DIAGNOSIS — Z01818 Encounter for other preprocedural examination: Secondary | ICD-10-CM | POA: Diagnosis not present

## 2022-11-29 DIAGNOSIS — H25811 Combined forms of age-related cataract, right eye: Secondary | ICD-10-CM | POA: Diagnosis not present

## 2022-11-29 DIAGNOSIS — H269 Unspecified cataract: Secondary | ICD-10-CM | POA: Diagnosis not present

## 2022-12-13 DIAGNOSIS — H269 Unspecified cataract: Secondary | ICD-10-CM | POA: Diagnosis not present

## 2022-12-13 DIAGNOSIS — H25812 Combined forms of age-related cataract, left eye: Secondary | ICD-10-CM | POA: Diagnosis not present

## 2023-02-26 ENCOUNTER — Telehealth: Payer: Self-pay | Admitting: Physician Assistant

## 2023-03-21 ENCOUNTER — Ambulatory Visit (INDEPENDENT_AMBULATORY_CARE_PROVIDER_SITE_OTHER)
Admission: RE | Admit: 2023-03-21 | Discharge: 2023-03-21 | Disposition: A | Discharge status: Home / Self Care | Payer: Medicare Other | Source: Ambulatory Visit | Attending: Nurse Practitioner | Admitting: Nurse Practitioner

## 2023-03-21 ENCOUNTER — Encounter: Payer: Self-pay | Admitting: Nurse Practitioner

## 2023-03-21 ENCOUNTER — Ambulatory Visit: Payer: Medicare Other | Admitting: Nurse Practitioner

## 2023-03-21 VITALS — BP 116/70 | HR 85 | Temp 98.5°F | Ht 69.0 in | Wt 175.0 lb

## 2023-03-21 DIAGNOSIS — L231 Allergic contact dermatitis due to adhesives: Secondary | ICD-10-CM

## 2023-03-21 DIAGNOSIS — M545 Low back pain, unspecified: Secondary | ICD-10-CM

## 2023-03-21 DIAGNOSIS — G8929 Other chronic pain: Secondary | ICD-10-CM | POA: Insufficient documentation

## 2023-03-21 DIAGNOSIS — M47816 Spondylosis without myelopathy or radiculopathy, lumbar region: Secondary | ICD-10-CM | POA: Diagnosis not present

## 2023-03-21 DIAGNOSIS — M5137 Other intervertebral disc degeneration, lumbosacral region: Secondary | ICD-10-CM | POA: Diagnosis not present

## 2023-03-21 DIAGNOSIS — M5136 Other intervertebral disc degeneration, lumbar region: Secondary | ICD-10-CM | POA: Diagnosis not present

## 2023-03-21 NOTE — Patient Instructions (Signed)
Nice to see you today I will be in touch with the xray results once I have them I do think it is a muscle or nerve that is bothering you Keep your appointment with me as scheduled. Be fasting and we can do all the blood work at that time

## 2023-03-21 NOTE — Assessment & Plan Note (Signed)
Having back pain this ambiguous in nature been going on over a year.  Without known injury.  Does sound nerve related or muscle spasm related.  Cannot elicit office with full range of motion.  Will do a lumbar picture likely secondary to some degenerative disc disease.  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  Pending x-ray

## 2023-03-21 NOTE — Assessment & Plan Note (Signed)
Well-demarcated area on the patient's right lower distal shin anteriorly.  Told patient to use hydrocortisone 1% cream over-the-counter twice daily but to avoid the wound

## 2023-03-21 NOTE — Progress Notes (Signed)
New Patient Office Visit  Subjective    Patient ID: Joseph Whitaker, male    DOB: March 25, 1946  Age: 77 y.o. MRN: [id number]  CC:  Chief Complaint  Patient presents with   Pain    Right side pain. Started a year ago. Burning sensation. Pt states soreness when sleeping on his side. Pain will last a week and then go away.     HPI Joseph Whitaker presents to establish care   Right low back pain: states that it has been a year intermittently with out injury. States that he does not know when it is going to come. Has not noticed any thing that makes it better or worse. States that he will take a tylenol. States that it iwll last 20-30 seconds. States that it will come and then go away for a couple months. States that when he sleeps on that side it will be sore the next day.  He has mentioned to PCP in the past but nothing every came of it per his report  States it has been going on for a year    At the end of the visit when walking patient to the lab he mentions that he is allergic to Band-Aids and has a rash probably had a Band-Aid.  He would like to know if there is anything use over-the-counter to help with this. Outpatient Encounter Medications as of 03/21/2023  Medication Sig   amLODipine (NORVASC) 5 MG tablet TAKE 1 TABLET(5 MG) BY MOUTH DAILY   augmented betamethasone dipropionate (DIPROLENE-AF) 0.05 % cream Apply topically 2 (two) times daily.   levothyroxine (SYNTHROID) 50 MCG tablet TAKE 1 TABLET(50 MCG) BY MOUTH DAILY BEFORE BREAKFAST   Misc Natural Products (CHOLESTEROL SUPPORT PO) Take 1 tablet by mouth 2 (two) times daily. Chlor Support   tamsulosin (FLOMAX) 0.4 MG CAPS capsule TAKE 1 CAPSULE(0.4 MG) BY MOUTH DAILY   No facility-administered encounter medications on file as of 03/21/2023.    Past Medical History:  Diagnosis Date   Arthritis    Cancer (HCC)    Skin   Hyperlipidemia    Hypertension    Hypothyroidism     Past Surgical History:  Procedure Laterality  Date   TOTAL HIP ARTHROPLASTY Left 12/05/2021   Procedure: TOTAL HIP ARTHROPLASTY ANTERIOR APPROACH;  Surgeon: Durene Romans, MD;  Location: [facility];  Service: Orthopedics;  Laterality: Left;   TOTAL KNEE ARTHROPLASTY Right 09/18/2016   Procedure: Right TOTAL KNEE ARTHROPLASTY;  Surgeon: Durene Romans, MD;  Location: [facility];  Service: Orthopedics;  Laterality: Right;  Adductor Block    Family History  Problem Relation Age of Onset   Dementia Father    Hyperlipidemia Sister    Heart attack Brother    Heart disease Maternal Grandmother    Heart disease Maternal Grandfather    Colon cancer Paternal Grandfather    Arthritis Brother    Hyperlipidemia Brother    Hyperlipidemia Brother     Social History   Socioeconomic History   Marital status: Married    Spouse name: Not on file   Number of children: 4   Years of education: Not on file   Highest education level: 12th grade  Occupational History   Occupation: farmer  Tobacco Use   Smoking status: Former    Current packs/day: 0.00    Types: Cigarettes    Quit date: 09/03/1985    Years since quitting: 37.5   Smokeless tobacco: Never  Vaping Use   Vaping status: Never  Used  Substance and Sexual Activity   Alcohol use: Not Currently    Alcohol/week: 0.0 - 1.0 standard drinks of alcohol   Drug use: No   Sexual activity: Not on file  Other Topics Concern   Not on file  Social History Narrative   Not on file   Social Determinants of Health   Financial Resource Strain: Low Risk  (02/03/2020)   Overall Financial Resource Strain (CARDIA)    Difficulty of Paying Living Expenses: Not hard at all  Food Insecurity: No Food Insecurity (02/03/2020)   Hunger Vital Sign    Worried About Running Out of Food in the Last Year: Never true    Ran Out of Food in the Last Year: Never true  Transportation Needs: No Transportation Needs (02/03/2020)   PRAPARE - Administrator, Civil Service (Medical): No    Lack of Transportation  (Non-Medical): No  Physical Activity: Inactive (02/03/2020)   Exercise Vital Sign    Days of Exercise per Week: 0 days    Minutes of Exercise per Session: 0 min  Stress: No Stress Concern Present (02/03/2020)   [employer] of Occupational Health - Occupational Stress Questionnaire    Feeling of Stress : Not at all  Social Connections: Moderately Isolated (02/03/2020)   Social Connection and Isolation Panel [NHANES]    Frequency of Communication with Friends and Family: More than three times a week    Frequency of Social Gatherings with Friends and Family: More than three times a week    Attends Religious Services: Never    Database administrator or Organizations: No    Attends Banker Meetings: Never    Marital Status: Married  Catering manager Violence: Not At Risk (02/03/2020)   Humiliation, Afraid, Rape, and Kick questionnaire    Fear of Current or Ex-Partner: No    Emotionally Abused: No    Physically Abused: No    Sexually Abused: No    Review of Systems  Constitutional:  Negative for chills and fever.  Respiratory:  Negative for shortness of breath.   Cardiovascular:  Negative for chest pain.  Musculoskeletal:  Positive for back pain.  Neurological:  Negative for tingling, weakness and headaches.        Objective    BP 116/70   Pulse 85   Temp 98.5 F (36.9 C) (Temporal)   Ht 5\' 9"  (1.753 m)   Wt 175 lb (79.4 kg)   SpO2 93%   BMI 25.84 kg/m   Physical Exam Vitals and nursing note reviewed.  Constitutional:      Appearance: Normal appearance.  Cardiovascular:     Rate and Rhythm: Normal rate and regular rhythm.     Heart sounds: Normal heart sounds.  Pulmonary:     Effort: Pulmonary effort is normal.     Breath sounds: Normal breath sounds.  Abdominal:     General: There is no distension.     Palpations: There is no mass.     Tenderness: There is no abdominal tenderness.     Hernia: No hernia is present.  Musculoskeletal:        General:  No tenderness or signs of injury.       Arms:     Lumbar back: No tenderness or bony tenderness. Negative right straight leg raise test and negative left straight leg raise test.  Skin:    Findings: Rash present.       Neurological:     Mental Status:  He is alert.         Assessment & Plan:   Problem List Items Addressed This Visit       Musculoskeletal and Integument   CD (contact dermatitis)    Well-demarcated area on the patient's right lower distal shin anteriorly.  Told patient to use hydrocortisone 1% cream over-the-counter twice daily but to avoid the wound        Other   Chronic right-sided low back pain without sciatica - Primary    Having back pain this ambiguous in nature been going on over a year.  Without known injury.  Does sound nerve related or muscle spasm related.  Cannot elicit office with full range of motion.  Will do a lumbar picture likely secondary to some degenerative disc disease.  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  Pending x-ray      Relevant Orders   DG Lumbar Spine Complete    Return if symptoms worsen or fail to improve, for As scheduled .   Audria Nine, NP

## 2023-03-29 ENCOUNTER — Ambulatory Visit: Payer: Medicare Other | Admitting: Physician Assistant

## 2023-06-07 ENCOUNTER — Encounter: Payer: Self-pay | Admitting: Nurse Practitioner

## 2023-06-07 ENCOUNTER — Ambulatory Visit (INDEPENDENT_AMBULATORY_CARE_PROVIDER_SITE_OTHER): Payer: Medicare Other | Admitting: Nurse Practitioner

## 2023-06-07 VITALS — BP 134/76 | HR 69 | Temp 98.2°F | Ht 69.0 in | Wt 175.0 lb

## 2023-06-07 DIAGNOSIS — G479 Sleep disorder, unspecified: Secondary | ICD-10-CM | POA: Diagnosis not present

## 2023-06-07 DIAGNOSIS — E039 Hypothyroidism, unspecified: Secondary | ICD-10-CM | POA: Diagnosis not present

## 2023-06-07 DIAGNOSIS — R0981 Nasal congestion: Secondary | ICD-10-CM | POA: Diagnosis not present

## 2023-06-07 DIAGNOSIS — Z1211 Encounter for screening for malignant neoplasm of colon: Secondary | ICD-10-CM

## 2023-06-07 DIAGNOSIS — I10 Essential (primary) hypertension: Secondary | ICD-10-CM | POA: Diagnosis not present

## 2023-06-07 DIAGNOSIS — Z87891 Personal history of nicotine dependence: Secondary | ICD-10-CM

## 2023-06-07 DIAGNOSIS — Z720 Tobacco use: Secondary | ICD-10-CM | POA: Insufficient documentation

## 2023-06-07 LAB — LIPID PANEL
Cholesterol: 156 mg/dL (ref 0–200)
HDL: 40.8 mg/dL (ref >39.00)
LDL Cholesterol: 104 mg/dL — ABNORMAL HIGH (ref 0–99)
NonHDL: 114.91
Total CHOL/HDL Ratio: 4
Triglycerides: 56 mg/dL (ref 0.0–149.0)
VLDL: 11.2 mg/dL (ref 0.0–40.0)

## 2023-06-07 LAB — COMPREHENSIVE METABOLIC PANEL
ALT: 18 U/L (ref 0–53)
AST: 21 U/L (ref 0–37)
Albumin: 4.3 g/dL (ref 3.5–5.2)
Alkaline Phosphatase: 53 U/L (ref 39–117)
BUN: 18 mg/dL (ref 6–23)
CO2: 29 meq/L (ref 19–32)
Calcium: 9.7 mg/dL (ref 8.4–10.5)
Chloride: 105 meq/L (ref 96–112)
Creatinine, Ser: 0.96 mg/dL (ref 0.40–1.50)
GFR: 76.81 mL/min (ref >60.00)
Glucose, Bld: 92 mg/dL (ref 70–99)
Potassium: 4.4 meq/L (ref 3.5–5.1)
Sodium: 141 meq/L (ref 135–145)
Total Bilirubin: 0.6 mg/dL (ref 0.2–1.2)
Total Protein: 6.8 g/dL (ref 6.0–8.3)

## 2023-06-07 LAB — CBC WITH DIFFERENTIAL/PLATELET
Basophils Absolute: 0 10*3/uL (ref 0.0–0.1)
Basophils Relative: 0.7 % (ref 0.0–3.0)
Eosinophils Absolute: 0.1 10*3/uL (ref 0.0–0.7)
Eosinophils Relative: 1.5 % (ref 0.0–5.0)
HCT: 41.2 % (ref 39.0–52.0)
Hemoglobin: 13.7 g/dL (ref 13.0–17.0)
Lymphocytes Relative: 28.1 % (ref 12.0–46.0)
Lymphs Abs: 1.1 10*3/uL (ref 0.7–4.0)
MCHC: 33.2 g/dL (ref 30.0–36.0)
MCV: 95.1 fL (ref 78.0–100.0)
Monocytes Absolute: 0.4 10*3/uL (ref 0.1–1.0)
Monocytes Relative: 10.3 % (ref 3.0–12.0)
Neutro Abs: 2.4 10*3/uL (ref 1.4–7.7)
Neutrophils Relative %: 59.4 % (ref 43.0–77.0)
Platelets: 293 10*3/uL (ref 150.0–400.0)
RBC: 4.33 Mil/uL (ref 4.22–5.81)
RDW: 13.1 % (ref 11.5–15.5)
WBC: 4 10*3/uL (ref 4.0–10.5)

## 2023-06-07 LAB — URINALYSIS, MICROSCOPIC ONLY: RBC / HPF: NONE SEEN (ref 0–?)

## 2023-06-07 LAB — TSH: TSH: 4.16 u[IU]/mL (ref 0.35–5.50)

## 2023-06-07 MED ORDER — TRAZODONE HCL 50 MG PO TABS: 25.0000 mg | ORAL_TABLET | Freq: Every evening | ORAL | 0 refills | Status: AC | PRN

## 2023-06-07 MED ORDER — FLUTICASONE PROPIONATE 50 MCG/ACT NA SUSP: 2.0000 | Freq: Every day | NASAL | 0 refills | Status: DC

## 2023-06-07 MED ORDER — CETIRIZINE HCL 10 MG PO TABS: 10.0000 mg | ORAL_TABLET | Freq: Every day | ORAL | 11 refills | Status: AC

## 2023-06-07 NOTE — Progress Notes (Signed)
New Patient Office Visit  Subjective    Patient ID: Joseph Whitaker, male    DOB: 1946/01/23  Age: 77 y.o. MRN: [id number]  CC:  Chief Complaint  Patient presents with   Establish Care    Needs refill for all medications. Would like blood work.     HPI MOUAD DRYDEN presents to establish care   HTN: states that he will check it every once and a while. Does well with the amlodipine without lightheadedness or dizziness   Hypothyroidism: levothryoxine and does it first thing   BPH: states that he does well with the flomax and 1-2 times a night. States that he was peeing more than he felt he should with difficulty starting stream   Immunizations: -Tetanus: Completed in 2010 -Influenza: refused -Shingles: zosta vax -Pneumonia: refused  Diet: Fair diet. 3 meals a day. No snacks. States that he drinks coffee and water  Exercise: No regular exercise. 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 Readers   Dental exam: needs updating   Colonoscopy: Completed in 2012, will pursue iFOB Lung Cancer Screening: NA  PSA: Due  Sleep: states that he will go to ed aroun 10 and does not rest too well. States that he takes melatonin. Staets that he takes it to stay asleep. States he will go to sleep easy but cannot sleep the whole time. States that he will get 4-6 at [facility] and he will wake up 2 times a night     Outpatient Encounter Medications as of 06/07/2023  Medication Sig   amLODipine (NORVASC) 5 MG tablet TAKE 1 TABLET(5 MG) BY MOUTH DAILY   augmented betamethasone dipropionate (DIPROLENE-AF) 0.05 % cream Apply topically 2 (two) times daily.   cetirizine (ZYRTEC) 10 MG tablet Take 1 tablet (10 mg total) by mouth daily.   fluticasone (FLONASE) 50 MCG/ACT nasal spray Place 2 sprays into both nostrils daily.   levothyroxine (SYNTHROID) 50 MCG tablet TAKE 1 TABLET(50 MCG) BY MOUTH DAILY BEFORE BREAKFAST   Misc Natural Products (CHOLESTEROL SUPPORT PO)  Take 1 tablet by mouth 2 (two) times daily. Chlor Support   tamsulosin (FLOMAX) 0.4 MG CAPS capsule TAKE 1 CAPSULE(0.4 MG) BY MOUTH DAILY   traZODone (DESYREL) 50 MG tablet Take 0.5-1 tablets (25-50 mg total) by mouth at bedtime as needed for sleep.   No facility-administered encounter medications on file as of 06/07/2023.    Past Medical History:  Diagnosis Date   Arthritis    Cancer (HCC)    Skin   Hyperlipidemia    Hypertension    Hypothyroidism    Kidney stone     Past Surgical History:  Procedure Laterality Date   TOTAL HIP ARTHROPLASTY Left 12/05/2021   Procedure: TOTAL HIP ARTHROPLASTY ANTERIOR APPROACH;  Surgeon: Durene Romans, MD;  Location: [facility];  Service: Orthopedics;  Laterality: Left;   TOTAL KNEE ARTHROPLASTY Right 09/18/2016   Procedure: Right TOTAL KNEE ARTHROPLASTY;  Surgeon: Durene Romans, MD;  Location: [facility];  Service: Orthopedics;  Laterality: Right;  Adductor Block    Family History  Problem Relation Age of Onset   Dementia Father    Hyperlipidemia Sister    Heart attack Brother    Heart disease Maternal Grandmother    Heart disease Maternal Grandfather    Colon cancer Paternal Grandfather    Arthritis Brother    Hyperlipidemia Brother    Hyperlipidemia Brother     Social History   Socioeconomic History  Marital status: Married    Spouse name: Not on file   Number of children: 4   Years of education: Not on file   Highest education level: 12th grade  Occupational History   Occupation: farmer  Tobacco Use   Smoking status: Former    Current packs/day: 0.00    Types: Cigarettes    Quit date: 09/03/1985    Years since quitting: 37.7   Smokeless tobacco: Never  Vaping Use   Vaping status: Never Used  Substance and Sexual Activity   Alcohol use: Not Currently    Alcohol/week: 0.0 - 1.0 standard drinks of alcohol   Drug use: No   Sexual activity: Not on file  Other Topics Concern   Not on file  Social History Narrative   Not on file    Social Determinants of Health   Financial Resource Strain: Low Risk  (02/03/2020)   Overall Financial Resource Strain (CARDIA)    Difficulty of Paying Living Expenses: Not hard at all  Food Insecurity: No Food Insecurity (02/03/2020)   Hunger Vital Sign    Worried About Running Out of Food in the Last Year: Never true    Ran Out of Food in the Last Year: Never true  Transportation Needs: No Transportation Needs (02/03/2020)   PRAPARE - Administrator, Civil Service (Medical): No    Lack of Transportation (Non-Medical): No  Physical Activity: Inactive (02/03/2020)   Exercise Vital Sign    Days of Exercise per Week: 0 days    Minutes of Exercise per Session: 0 min  Stress: No Stress Concern Present (02/03/2020)   [employer] of Occupational Health - Occupational Stress Questionnaire    Feeling of Stress : Not at all  Social Connections: Moderately Isolated (02/03/2020)   Social Connection and Isolation Panel [NHANES]    Frequency of Communication with Friends and Family: More than three times a week    Frequency of Social Gatherings with Friends and Family: More than three times a week    Attends Religious Services: Never    Database administrator or Organizations: No    Attends Banker Meetings: Never    Marital Status: Married  Catering manager Violence: Not At Risk (02/03/2020)   Humiliation, Afraid, Rape, and Kick questionnaire    Fear of Current or Ex-Partner: No    Emotionally Abused: No    Physically Abused: No    Sexually Abused: No    Review of Systems  Constitutional:  Negative for chills and fever.  Respiratory:  Negative for shortness of breath.   Cardiovascular:  Negative for chest pain and leg swelling.  Gastrointestinal:  Negative for abdominal pain, blood in stool, constipation, diarrhea, nausea and vomiting.       BM daily   Genitourinary:  Negative for dysuria and hematuria.  Neurological:  Negative for tingling and headaches.   Psychiatric/Behavioral:  Negative for hallucinations and suicidal ideas.         Objective    BP 134/76   Pulse 69   Temp 98.2 F (36.8 C) (Oral)   Ht 5\' 9"  (1.753 m)   Wt 175 lb (79.4 kg)   SpO2 94%   BMI 25.84 kg/m   Physical Exam Vitals and nursing note reviewed.  Constitutional:      Appearance: Normal appearance.  HENT:     Right Ear: Tympanic membrane, ear canal and external ear normal.     Left Ear: Tympanic membrane, ear canal and external ear  normal.     Mouth/Throat:     Mouth: Mucous membranes are moist.     Pharynx: Oropharynx is clear.  Eyes:     Extraocular Movements: Extraocular movements intact.     Pupils: Pupils are equal, round, and reactive to light.  Cardiovascular:     Rate and Rhythm: Normal rate and regular rhythm.     Pulses: Normal pulses.     Heart sounds: Normal heart sounds.  Pulmonary:     Effort: Pulmonary effort is normal.     Breath sounds: Normal breath sounds.  Abdominal:     General: Bowel sounds are normal. There is no distension.     Palpations: There is no mass.     Tenderness: There is no abdominal tenderness.     Hernia: No hernia is present.  Musculoskeletal:     Right lower leg: No edema.     Left lower leg: No edema.  Lymphadenopathy:     Cervical: No cervical adenopathy.  Skin:    General: Skin is warm.  Neurological:     General: No focal deficit present.     Mental Status: He is alert.     Deep Tendon Reflexes:     Reflex Scores:      Bicep reflexes are 2+ on the right side and 2+ on the left side.      Patellar reflexes are 2+ on the right side and 2+ on the left side.    Comments: Bilateral upper and lower extremity strength 5/5  Psychiatric:        Mood and Affect: Mood normal.        Behavior: Behavior normal.        Thought Content: Thought content normal.        Judgment: Judgment normal.         Assessment & Plan:   Problem List Items Addressed This Visit       Cardiovascular and  Mediastinum   Essential (primary) hypertension - Primary    Currently maintained on amlodipine 5 mg.  Tolerates medication well.  Well-controlled.  Continue      Relevant Orders   Lipid panel   CBC with Differential/Platelet   Comprehensive metabolic panel   TSH     Endocrine   Hypothyroidism    Patient currently maintained on levothyroxine 50 mcg daily.  Pending TSH.  Continue medication as prescribed      Relevant Orders   Comprehensive metabolic panel   TSH     Other   Tobacco abuse    History of the same check urine microscopy to rule out microscopic hematuria      Nasal congestion    Has tried Claritin over-the-counter not good relief will switch to Zyrtec 10 mg nightly.  Also add on Flonase 50 mcg per actuation 2 sprays each nostril daily.  Epistaxis precautions reviewed      Relevant Medications   cetirizine (ZYRTEC) 10 MG tablet   fluticasone (FLONASE) 50 MCG/ACT nasal spray   Sleep disturbance    Has been using melatonin without great relief.  Will try trazodone 25 to 50 mg nightly as needed.  Patient to take either that or melatonin not both.  Also if he starts Zyrtec try it first for several days before adding in trazodone.      Relevant Medications   traZODone (DESYREL) 50 MG tablet   Other Visit Diagnoses     Screening for colon cancer       Relevant Orders  Fecal occult blood, imunochemical       Return in about 6 months (around 12/06/2023) for BP recheck, BPH f/u.   Audria Nine, NP

## 2023-06-07 NOTE — Assessment & Plan Note (Signed)
Patient currently maintained on levothyroxine 50 mcg daily.  Pending TSH.  Continue medication as prescribed

## 2023-06-07 NOTE — Assessment & Plan Note (Signed)
History of the same check urine microscopy to rule out microscopic hematuria

## 2023-06-07 NOTE — Assessment & Plan Note (Signed)
Has tried Claritin over-the-counter not good relief will switch to Zyrtec 10 mg nightly.  Also add on Flonase 50 mcg per actuation 2 sprays each nostril daily.  Epistaxis precautions reviewed

## 2023-06-07 NOTE — Assessment & Plan Note (Signed)
Currently maintained on amlodipine 5 mg.  Tolerates medication well.  Well-controlled.  Continue

## 2023-06-07 NOTE — Assessment & Plan Note (Signed)
Has been using melatonin without great relief.  Will try trazodone 25 to 50 mg nightly as needed.  Patient to take either that or melatonin not both.  Also if he starts Zyrtec try it first for several days before adding in trazodone.

## 2023-06-13 ENCOUNTER — Telehealth: Payer: Self-pay | Admitting: Nurse Practitioner

## 2023-06-13 DIAGNOSIS — N401 Enlarged prostate with lower urinary tract symptoms: Secondary | ICD-10-CM

## 2023-06-13 NOTE — Telephone Encounter (Signed)
Pt called stating the pharmacy says they haven't received any rx from [place]. Pt states when he was in the office for his np visit, there was some computer issues. Pt asked can the rxs be resent? Call back # [phone].

## 2023-06-17 ENCOUNTER — Other Ambulatory Visit (INDEPENDENT_AMBULATORY_CARE_PROVIDER_SITE_OTHER): Payer: Medicare Other

## 2023-06-17 DIAGNOSIS — Z1211 Encounter for screening for malignant neoplasm of colon: Secondary | ICD-10-CM | POA: Diagnosis not present

## 2023-06-17 MED ORDER — TAMSULOSIN HCL 0.4 MG PO CAPS: 0.4000 mg | ORAL_CAPSULE | Freq: Every day | ORAL | 1 refills | Status: DC

## 2023-06-17 NOTE — Telephone Encounter (Signed)
Prescription Request  06/17/2023  LOV: 06/07/2023  What is the name of the medication or equipment? tamsulosin (FLOMAX) 0.4 MG CAPS capsule   Have you contacted your pharmacy to request a refill? No   Which pharmacy would you like this sent to?  [id number] - [place], [place] - [address] AT [facility] [address] [facility] [address] [place] [address] Phone: [phone] Fax: [phone]    Patient notified that their request is being sent to the clinical staff for review and that they should receive a response within 2 business days.   Please advise at Mobile [phone] (mobile)

## 2023-06-17 NOTE — Telephone Encounter (Signed)
LAST APPOINTMENT DATE: 06/07/2023   NEXT APPOINTMENT DATE: 12/06/2023   Flomax 0.4mg   LAST REFILL: 11/05/22  QTY: #[id number]

## 2023-06-17 NOTE — Telephone Encounter (Signed)
Refill provided

## 2023-06-17 NOTE — Addendum Note (Signed)
Addended by: Eden Emms on: 06/17/2023 01:12 PM   Modules accepted: Orders

## 2023-06-18 LAB — FECAL OCCULT BLOOD, IMMUNOCHEMICAL: Fecal Occult Bld: NEGATIVE

## 2023-06-30 DIAGNOSIS — C44619 Basal cell carcinoma of skin of left upper limb, including shoulder: Secondary | ICD-10-CM | POA: Diagnosis not present

## 2023-06-30 DIAGNOSIS — C44311 Basal cell carcinoma of skin of nose: Secondary | ICD-10-CM | POA: Diagnosis not present

## 2023-07-01 DIAGNOSIS — D2272 Melanocytic nevi of left lower limb, including hip: Secondary | ICD-10-CM | POA: Diagnosis not present

## 2023-07-01 DIAGNOSIS — D225 Melanocytic nevi of trunk: Secondary | ICD-10-CM | POA: Diagnosis not present

## 2023-07-01 DIAGNOSIS — D2261 Melanocytic nevi of right upper limb, including shoulder: Secondary | ICD-10-CM | POA: Diagnosis not present

## 2023-07-01 DIAGNOSIS — Z85820 Personal history of malignant melanoma of skin: Secondary | ICD-10-CM | POA: Diagnosis not present

## 2023-07-01 DIAGNOSIS — Z86006 Personal history of melanoma in-situ: Secondary | ICD-10-CM | POA: Diagnosis not present

## 2023-07-01 DIAGNOSIS — D2262 Melanocytic nevi of left upper limb, including shoulder: Secondary | ICD-10-CM | POA: Diagnosis not present

## 2023-07-01 DIAGNOSIS — L570 Actinic keratosis: Secondary | ICD-10-CM | POA: Diagnosis not present

## 2023-07-01 DIAGNOSIS — X32XXXA Exposure to sunlight, initial encounter: Secondary | ICD-10-CM | POA: Diagnosis not present

## 2023-07-01 DIAGNOSIS — D485 Neoplasm of uncertain behavior of skin: Secondary | ICD-10-CM | POA: Diagnosis not present

## 2023-07-01 DIAGNOSIS — Z85828 Personal history of other malignant neoplasm of skin: Secondary | ICD-10-CM | POA: Diagnosis not present

## 2023-07-23 DIAGNOSIS — C44619 Basal cell carcinoma of skin of left upper limb, including shoulder: Secondary | ICD-10-CM | POA: Diagnosis not present

## 2023-07-24 ENCOUNTER — Other Ambulatory Visit: Payer: Self-pay | Admitting: Nurse Practitioner

## 2023-07-24 DIAGNOSIS — G479 Sleep disorder, unspecified: Secondary | ICD-10-CM

## 2023-07-24 NOTE — Telephone Encounter (Signed)
Called patient he has been sleeping ok and not taking anything. He did not request refill and does not want.

## 2023-10-01 ENCOUNTER — Ambulatory Visit (INDEPENDENT_AMBULATORY_CARE_PROVIDER_SITE_OTHER): Payer: Medicare Other

## 2023-10-01 VITALS — Ht 69.0 in | Wt 175.0 lb

## 2023-10-01 DIAGNOSIS — Z0000 Encounter for general adult medical examination without abnormal findings: Secondary | ICD-10-CM

## 2023-10-01 NOTE — Progress Notes (Signed)
Subjective:   Joseph Whitaker is a 78 y.o. male who presents for Medicare Annual/Subsequent preventive examination.  Visit Complete: Virtual I connected with  Joseph Whitaker on 10/01/23 by a audio enabled telemedicine application and verified that I am speaking with the correct person using two identifiers.  Patient Location: Home  Provider Location: Home Office  I discussed the limitations of evaluation and management by telemedicine. The patient expressed understanding and agreed to proceed.  Vital Signs: Because this visit was a virtual/telehealth visit, some criteria may be missing or patient reported. Any vitals not documented were not able to be obtained and vitals that have been documented are patient reported.  Patient Medicare AWV questionnaire was completed by the patient on (not done); I have confirmed that all information answered by patient is correct and no changes since this date.  Cardiac Risk Factors include: advanced age (>90men, >86 women);dyslipidemia;hypertension;male gender     Objective:    Today's Vitals   10/01/23 0905  Weight: 175 lb (79.4 kg)  Height: 5\' 9"  (1.753 m)  PainSc: 0-No pain   Body mass index is 25.84 kg/m.     10/01/2023    9:19 AM 06/27/2022   10:57 AM 12/05/2021    3:46 PM 11/22/2021   10:42 AM 02/03/2020    9:21 AM 01/28/2019    2:39 PM 01/03/2018    8:57 AM  Advanced Directives  Does Patient Have a Medical Advance Directive? No No No No No No No  Does patient want to make changes to medical advance directive?      No - Patient declined   Would patient like information on creating a medical advance directive?  No - Patient declined No - Patient declined  No - Patient declined  No - Patient declined    Current Medications (verified) Outpatient Encounter Medications as of 10/01/2023  Medication Sig   amLODipine (NORVASC) 5 MG tablet TAKE 1 TABLET(5 MG) BY MOUTH DAILY   cetirizine (ZYRTEC) 10 MG tablet Take 1 tablet (10 mg total) by  mouth daily.   fluticasone (FLONASE) 50 MCG/ACT nasal spray Place 2 sprays into both nostrils daily.   levothyroxine (SYNTHROID) 50 MCG tablet TAKE 1 TABLET(50 MCG) BY MOUTH DAILY BEFORE BREAKFAST   Misc Natural Products (CHOLESTEROL SUPPORT PO) Take 1 tablet by mouth 2 (two) times daily. Chlor Support   tamsulosin (FLOMAX) 0.4 MG CAPS capsule Take 1 capsule (0.4 mg total) by mouth daily.   traZODone (DESYREL) 50 MG tablet Take 0.5-1 tablets (25-50 mg total) by mouth at bedtime as needed for sleep.   augmented betamethasone dipropionate (DIPROLENE-AF) 0.05 % cream Apply topically 2 (two) times daily.   No facility-administered encounter medications on file as of 10/01/2023.    Allergies (verified) Red yeast rice [cholestin] and Statins   History: Past Medical History:  Diagnosis Date   Arthritis    Cancer (HCC)    Skin   Hyperlipidemia    Hypertension    Hypothyroidism    Kidney stone    Past Surgical History:  Procedure Laterality Date   TOTAL HIP ARTHROPLASTY Left 12/05/2021   Procedure: TOTAL HIP ARTHROPLASTY ANTERIOR APPROACH;  Surgeon: Durene Romans, MD;  Location: [facility];  Service: Orthopedics;  Laterality: Left;   TOTAL KNEE ARTHROPLASTY Right 09/18/2016   Procedure: Right TOTAL KNEE ARTHROPLASTY;  Surgeon: Durene Romans, MD;  Location: [facility];  Service: Orthopedics;  Laterality: Right;  Adductor Block   Family History  Problem Relation Age of Onset  Dementia Father    Hyperlipidemia Sister    Heart attack Brother    Heart disease Maternal Grandmother    Heart disease Maternal Grandfather    Colon cancer Paternal Grandfather    Arthritis Brother    Hyperlipidemia Brother    Hyperlipidemia Brother    Social History   Socioeconomic History   Marital status: Married    Spouse name: Not on file   Number of children: 4   Years of education: Not on file   Highest education level: 12th grade  Occupational History   Occupation: farmer  Tobacco Use   Smoking status:  Former    Current packs/day: 0.00    Types: Cigarettes    Quit date: 09/03/1985    Years since quitting: 38.1   Smokeless tobacco: Never  Vaping Use   Vaping status: Never Used  Substance and Sexual Activity   Alcohol use: Not Currently    Alcohol/week: 0.0 - 1.0 standard drinks of alcohol   Drug use: No   Sexual activity: Not on file  Other Topics Concern   Not on file  Social History Narrative   Not on file   Social Drivers of Health   Financial Resource Strain: Low Risk  (10/01/2023)   Overall Financial Resource Strain (CARDIA)    Difficulty of Paying Living Expenses: Not hard at all  Food Insecurity: No Food Insecurity (10/01/2023)   Hunger Vital Sign    Worried About Running Out of Food in the Last Year: Never true    Ran Out of Food in the Last Year: Never true  Transportation Needs: No Transportation Needs (10/01/2023)   PRAPARE - Administrator, Civil Service (Medical): No    Lack of Transportation (Non-Medical): No  Physical Activity: Sufficiently Active (10/01/2023)   Exercise Vital Sign    Days of Exercise per Week: 7 days    Minutes of Exercise per Session: 60 min  Stress: No Stress Concern Present (10/01/2023)   [employer] of Occupational Health - Occupational Stress Questionnaire    Feeling of Stress : Not at all  Social Connections: Moderately Integrated (10/01/2023)   Social Connection and Isolation Panel [NHANES]    Frequency of Communication with Friends and Family: More than three times a week    Frequency of Social Gatherings with Friends and Family: More than three times a week    Attends Religious Services: More than 4 times per year    Active Member of [employer] or Organizations: No    Attends Engineer, structural: Never    Marital Status: Married    Tobacco Counseling Counseling given: Not Answered  Clinical Intake:  Pre-visit preparation completed: No  Pain : No/denies pain Pain Score: 0-No pain    BMI - recorded:  25.84 Nutritional Status: BMI 25 -29 Overweight Nutritional Risks: None Diabetes: No  How often do you need to have someone help you when you read instructions, pamphlets, or other written materials from your doctor or pharmacy?: 1 - Never  Interpreter Needed?: No  Comments: lives with wife Information entered by :: B.Nusayba Cadenas,LPN   Activities of Daily Living    10/01/2023    9:20 AM  In your present state of health, do you have any difficulty performing the following activities:  Hearing? 0  Vision? 0  Difficulty concentrating or making decisions? 0  Walking or climbing stairs? 0  Dressing or bathing? 0  Doing errands, shopping? 0  Preparing Food and eating ? N  Using the  Toilet? N  In the past six months, have you accidently leaked urine? N  Do you have problems with loss of bowel control? N  Managing your Medications? N  Managing your Finances? N  Housekeeping or managing your Housekeeping? N    Patient Care Team: Eden Emms, NP as PCP - General (Nurse Practitioner)  Indicate any recent Medical Services you may have received from other than [facility] providers in the past year (date may be approximate).     Assessment:   This is a routine wellness examination for Neko.  Hearing/Vision screen Hearing Screening - Comments:: Pt says his hearing good Vision Screening - Comments:: Pt says his vision is fine [facility]   Goals Addressed             This Visit's Progress    DIET - INCREASE WATER INTAKE   On track    Recommend increasing water intake to 6-8 glasses a day.       Depression Screen    10/01/2023    9:14 AM 03/21/2023    2:47 PM 09/26/2022    8:46 AM 09/18/2021    2:22 PM 09/26/2020   10:08 AM 02/03/2020    9:20 AM 01/28/2019    2:39 PM  PHQ 2/9 Scores  PHQ - 2 Score 0 0 0 0 0 0 0  PHQ- 9 Score  0 0 0 1      Fall Risk    10/01/2023    9:09 AM 03/21/2023    2:47 PM 09/26/2022    8:46 AM 09/18/2021    2:22 PM 09/26/2020   10:09 AM  Fall  Risk   Falls in the past year? 0 0 0 0 0  Number falls in past yr: 0 0 0 0 0  Injury with Fall? 0 0 0 0 0  Risk for fall due to : No Fall Risks No Fall Risks No Fall Risks No Fall Risks   Follow up Education provided;Falls prevention discussed Falls evaluation completed Falls evaluation completed  Falls evaluation completed    MEDICARE RISK AT HOME: Medicare Risk at Home Any stairs in or around the home?: No If so, are there any without handrails?: No Home free of loose throw rugs in walkways, pet beds, electrical cords, etc?: Yes Adequate lighting in your home to reduce risk of falls?: Yes Life alert?: No Use of a cane, walker or w/c?: No Grab bars in the bathroom?: No Shower chair or bench in shower?: No Elevated toilet seat or a handicapped toilet?: No  TIMED UP AND GO:  Was the test performed?  No    Cognitive Function:        10/01/2023    9:23 AM  6CIT Screen  What Year? 0 points  What month? 0 points  What time? 0 points  Count back from 20 0 points  Months in reverse 0 points  Repeat phrase 0 points  Total Score 0 points    Immunizations Immunization History  Administered Date(s) Administered   Fluad Quad(high Dose 65+) 08/10/2019   Influenza Split 08/11/2009, 08/14/2010, 08/17/2011   Influenza, High Dose Seasonal PF 08/06/2016, 09/09/2017   Tdap 04/04/2009   Zoster, Live 07/02/2008    TDAP status: Up to date  Flu Vaccine status: Declined, Education has been provided regarding the importance of this vaccine but patient still declined. Advised may receive this vaccine at local pharmacy or Health Dept. Aware to provide a copy of the vaccination record if obtained from local  pharmacy or Health Dept. Verbalized acceptance and understanding.  Pneumococcal vaccine status: Declined,  Education has been provided regarding the importance of this vaccine but patient still declined. Advised may receive this vaccine at local pharmacy or Health Dept. Aware to provide a  copy of the vaccination record if obtained from local pharmacy or Health Dept. Verbalized acceptance and understanding.   Covid-19 vaccine status: Declined, Education has been provided regarding the importance of this vaccine but patient still declined. Advised may receive this vaccine at local pharmacy or Health Dept.or vaccine clinic. Aware to provide a copy of the vaccination record if obtained from local pharmacy or Health Dept. Verbalized acceptance and understanding.  Qualifies for Shingles Vaccine? Yes   Zostavax completed No   Shingrix Completed?: No.    Education has been provided regarding the importance of this vaccine. Patient has been advised to call insurance company to determine out of pocket expense if they have not yet received this vaccine. Advised may also receive vaccine at local pharmacy or Health Dept. Verbalized acceptance and understanding.  Screening Tests Health Maintenance  Topic Date Due   Zoster Vaccines- Shingrix (1 of 2) 12/21/1965   Pneumonia Vaccine 107+ Years old (1 of 1 - PCV) Never done   DTaP/Tdap/Td (2 - Td or Tdap) 04/05/2019   INFLUENZA VACCINE  12/02/2023 (Originally 04/04/2023)   COVID-19 Vaccine (1) 04/06/2024 (Originally 12/22/1950)   Medicare Annual Wellness (AWV)  09/30/2024   Hepatitis C Screening  Completed   HPV VACCINES  Aged Out   Colonoscopy  Discontinued    Health Maintenance  Health Maintenance Due  Topic Date Due   Zoster Vaccines- Shingrix (1 of 2) 12/21/1965   Pneumonia Vaccine 62+ Years old (1 of 1 - PCV) Never done   DTaP/Tdap/Td (2 - Td or Tdap) 04/05/2019    Colorectal cancer screening: No longer required.   Lung Cancer Screening: (Low Dose CT Chest recommended if Age 70-80 years, 20 pack-year currently smoking OR have quit w/in 15years.) does not qualify.   Lung Cancer Screening Referral: no  Additional Screening:  Hepatitis C Screening: does not qualify; Completed   Vision Screening: Recommended annual ophthalmology  exams for early detection of glaucoma and other disorders of the eye. Is the patient up to date with their annual eye exam?  Yes  Who is the provider or what is the name of the office in which the patient attends annual eye exams?  Eye If pt is not established with a provider, would they like to be referred to a provider to establish care? No .   Dental Screening: Recommended annual dental exams for proper oral hygiene  Diabetic Foot Exam: n/a  Community Resource Referral / Chronic Care Management: CRR required this visit?  No   CCM required this visit?  No    Plan:    I have personally reviewed and noted the following in the patient's chart:   Medical and social history Use of alcohol, tobacco or illicit drugs  Current medications and supplements including opioid prescriptions. Patient is not currently taking opioid prescriptions. Functional ability and status Nutritional status Physical activity Advanced directives List of other physicians Hospitalizations, surgeries, and ER visits in previous 12 months Vitals Screenings to include cognitive, depression, and falls Referrals and appointments  In addition, I have reviewed and discussed with patient certain preventive protocols, quality metrics, and best practice recommendations. A written personalized care plan for preventive services as well as general preventive health recommendations were provided to patient.  Sue Lush, LPN   [id number]   After Visit Summary: (MyChart) Due to this being a telephonic visit, the after visit summary with patients personalized plan was offered to patient via MyChart   Nurse Notes: The patient states he is doing well and has no concerns or questions at this time.

## 2023-10-01 NOTE — Patient Instructions (Signed)
Joseph Whitaker , Thank you for taking time to come for your Medicare Wellness Visit. I appreciate your ongoing commitment to your health goals. Please review the following plan we discussed and let me know if I can assist you in the future.   Referrals/Orders/Follow-Ups/Clinician Recommendations: none  This is a list of the screening recommended for you and due dates:  Health Maintenance  Topic Date Due   Zoster (Shingles) Vaccine (1 of 2) 12/21/1965   Pneumonia Vaccine (1 of 1 - PCV) Never done   DTaP/Tdap/Td vaccine (2 - Td or Tdap) 04/05/2019   Flu Shot  12/02/2023*   COVID-19 Vaccine (1) 04/06/2024*   Medicare Annual Wellness Visit  09/30/2024   Hepatitis C Screening  Completed   HPV Vaccine  Aged Out   Colon Cancer Screening  Discontinued  *Topic was postponed. The date shown is not the original due date.    Advanced directives: (Declined) Advance directive discussed with you today. Even though you declined this today, please call our office should you change your mind, and we can give you the proper paperwork for you to fill out.  Next Medicare Annual Wellness Visit scheduled for next year: Yes 10/01/2024 @ 8:50am televisit

## 2023-10-25 ENCOUNTER — Ambulatory Visit: Payer: Medicare Other | Admitting: Nurse Practitioner

## 2023-10-25 ENCOUNTER — Other Ambulatory Visit (HOSPITAL_COMMUNITY): Payer: Self-pay

## 2023-10-25 ENCOUNTER — Other Ambulatory Visit: Payer: Self-pay | Admitting: Nurse Practitioner

## 2023-10-25 VITALS — BP 138/80 | HR 69 | Temp 98.3°F | Ht 69.0 in | Wt 179.4 lb

## 2023-10-25 DIAGNOSIS — M545 Low back pain, unspecified: Secondary | ICD-10-CM

## 2023-10-25 DIAGNOSIS — N50811 Right testicular pain: Secondary | ICD-10-CM

## 2023-10-25 DIAGNOSIS — R8290 Unspecified abnormal findings in urine: Secondary | ICD-10-CM | POA: Diagnosis not present

## 2023-10-25 LAB — POCT URINALYSIS DIPSTICK
Bilirubin, UA: NEGATIVE
Blood, UA: NEGATIVE
Glucose, UA: NEGATIVE
Ketones, UA: NEGATIVE
Leukocytes, UA: NEGATIVE
Nitrite, UA: NEGATIVE
Protein, UA: POSITIVE — AB
Spec Grav, UA: 1.025 (ref 1.010–1.025)
Urobilinogen, UA: 0.2 U/dL
pH, UA: 5.5 (ref 5.0–8.0)

## 2023-10-25 MED ORDER — METHYLPREDNISOLONE ACETATE 40 MG/ML IJ SUSP: 40.0000 mg | Freq: Once | INTRAMUSCULAR | Status: DC

## 2023-10-25 MED ORDER — METHYLPREDNISOLONE ACETATE 80 MG/ML IJ SUSP
80.0000 mg | Freq: Once | INTRAMUSCULAR | Status: AC
  Administered 2023-10-25: 40 mg via INTRAMUSCULAR

## 2023-10-25 MED ORDER — METHOCARBAMOL 500 MG PO TABS: 500.0000 mg | ORAL_TABLET | Freq: Two times a day (BID) | ORAL | 0 refills | Status: AC | PRN

## 2023-10-25 NOTE — Assessment & Plan Note (Addendum)
POCT urine dip was reviewed and there was no hematuria, nitrites, or leukocytes noted. Proteinuria was present. Urine culture pending  I evaluated patient, was consulted regarding treatment, and agree with assessment and plan per Denice Bors RN, FNP Student   Audria Nine, DNP, AGNP-C

## 2023-10-25 NOTE — Progress Notes (Signed)
Acute Office Visit  Subjective:     Patient ID: Joseph Whitaker, male    DOB: 1945/11/12, 78 y.o.   MRN: [id number]  Chief Complaint  Patient presents with   Back Pain    Pt complains of lower back pain that started on 2/4. Pt complains that the back pain radiates to his groin area. Dull achy pain. Pt states that its hard to sit and says he has to eat standing. Pt also complains hurts to strain when urinating. Takes longer for him to urinate. Taking OTC aleve for pain.    Testicle Pain    HPI Patient is in today for back and testicle pain with a history of HTN, BPH, nephrolithiasis, skin CA,  Lumbar xray on 03/21/2023 showed degenerative changes with disc space narrowing at the L5-S1 level  Symptoms started over 2 weeks ago after he was lifting something. He describes the pain is described as dull that radiates down to the groin and is intermittent. Worse with prolonged sitting and when he strains with a BM. States that better with laying down and first thing in the morning. State that he has been doing aleve every 12 hours with some relief.   Review of Systems  Constitutional:  Negative for chills and fever.  Respiratory:  Negative for shortness of breath.   Cardiovascular:  Negative for chest pain.  Gastrointestinal:  Negative for abdominal pain, constipation, diarrhea, nausea and vomiting.  Genitourinary:  Negative for dysuria, frequency and hematuria.  Musculoskeletal:  Positive for back pain.  Neurological:  Negative for tingling and headaches.        Objective:    BP 138/80   Pulse 69   Temp 98.3 F (36.8 C) (Oral)   Ht 5\' 9"  (1.753 m)   Wt 179 lb 6.4 oz (81.4 kg)   SpO2 95%   BMI 26.49 kg/m    Physical Exam Vitals and nursing note reviewed. Exam conducted with a chaperone present Melina Copa, CMA).  Constitutional:      Appearance: Normal appearance.  Cardiovascular:     Rate and Rhythm: Normal rate and regular rhythm.     Heart sounds: Normal heart  sounds.  Pulmonary:     Effort: Pulmonary effort is normal.     Breath sounds: Normal breath sounds.  Abdominal:     General: Bowel sounds are normal. There is no distension.     Palpations: There is no mass.     Tenderness: There is no abdominal tenderness. There is no right CVA tenderness or left CVA tenderness.     Hernia: No hernia is present. There is no hernia in the left inguinal area or right inguinal area.  Genitourinary:    Penis: Normal.      Testes:        Right: Tenderness present. Mass or swelling not present.        Left: Tenderness present. Mass or swelling not present.     Epididymis:     Right: Normal.     Left: Normal.     Comments: Testicle not high riding in the scrotum Musculoskeletal:     Lumbar back: Tenderness present. No bony tenderness. Positive right straight leg raise test. Negative left straight leg raise test.  Lymphadenopathy:     Lower Body: No right inguinal adenopathy. No left inguinal adenopathy.  Neurological:     Mental Status: He is alert.     Results for orders placed or performed in visit on 10/25/23  POCT urinalysis  dipstick  Result Value Ref Range   Color, UA dark yellow    Clarity, UA clear    Glucose, UA Negative Negative   Bilirubin, UA neg    Ketones, UA neg    Spec Grav, UA 1.025 1.010 - 1.025   Blood, UA neg    pH, UA 5.5 5.0 - 8.0   Protein, UA Positive (A) Negative   Urobilinogen, UA 0.2 0.2 or 1.0 E.U./dL   Nitrite, UA neg    Leukocytes, UA Negative Negative   Appearance     Odor          Assessment & Plan:   Problem List Items Addressed This Visit       Other   Right lumbar pain - Primary   Given the onset after lifting and pain with certain positions this is likely lumbar radiculopathy. Other differential considered, but not limited to nephrolithiasis, inguinal hernia, epididymitis, orchitis, prostatitis, and other intraabdominal etiology. POCT urine dip was reviewed and there was no hematuria, nitrites, or  leukocytes noted. Proteinuria was present.  Methylprednisolone 40 mg IM injection administered in office.  Methocarbamol 500 mg twice daily PRN for muscle spasms. Pt educated about sedative side effects of medication. Strict return precautions and fall precautions given.   I evaluated patient, was consulted regarding treatment, and agree with assessment and plan per Denice Bors RN, FNP Student   Audria Nine, DNP, AGNP-C       Relevant Medications   methocarbamol (ROBAXIN) 500 MG tablet   Testicular pain, right   POCT urine dip was reviewed and there was no hematuria, nitrites, or leukocytes noted. Proteinuria was present. Given the testicular tenderness pt was offered US doppler, but he declined today and will follow up for [place] if pain is not improved by Monday. Red flag symptoms reviewed with patient and strict return precautions given.    I evaluated patient, was consulted regarding treatment, and agree with assessment and plan per Denice Bors RN, FNP Student   Audria Nine, DNP, AGNP-C       Relevant Orders   POCT urinalysis dipstick (Completed)   Abnormal urinalysis   POCT urine dip was reviewed and there was no hematuria, nitrites, or leukocytes noted. Proteinuria was present. Urine culture pending  I evaluated patient, was consulted regarding treatment, and agree with assessment and plan per Denice Bors RN, FNP Student   Audria Nine, DNP, AGNP-C       Relevant Orders   Urine Culture    Meds ordered this encounter  Medications   DISCONTD: methylPREDNISolone acetate (DEPO-MEDROL) injection 40 mg   methocarbamol (ROBAXIN) 500 MG tablet    Sig: Take 1 tablet (500 mg total) by mouth 2 (two) times daily as needed for muscle spasms.    Dispense:  20 tablet    Refill:  0   methylPREDNISolone acetate (DEPO-MEDROL) injection 80 mg    Return if symptoms worsen or fail to improve.  Audria Nine, NP

## 2023-10-25 NOTE — Assessment & Plan Note (Addendum)
Given the onset after lifting and pain with certain positions this is likely lumbar radiculopathy. Other differential considered, but not limited to nephrolithiasis, inguinal hernia, epididymitis, orchitis, prostatitis, and other intraabdominal etiology. POCT urine dip was reviewed and there was no hematuria, nitrites, or leukocytes noted. Proteinuria was present.  Methylprednisolone 40 mg IM injection administered in office.  Methocarbamol 500 mg twice daily PRN for muscle spasms. Pt educated about sedative side effects of medication. Strict return precautions and fall precautions given.   I evaluated patient, was consulted regarding treatment, and agree with assessment and plan per Denice Bors RN, FNP Student   Audria Nine, DNP, AGNP-C

## 2023-10-25 NOTE — Patient Instructions (Signed)
It was a pleasure seeing you today.  We have sent a prescription for a muscle relaxer to your pharmacy.  Please do not drive or operate heavy machinery if you take this and take extra precautions to prevent falls.  If you have worsening pain, swelling in your testicles, fever, or any other concerning symptoms please be seen immediately.

## 2023-10-25 NOTE — Assessment & Plan Note (Addendum)
POCT urine dip was reviewed and there was no hematuria, nitrites, or leukocytes noted. Proteinuria was present. Given the testicular tenderness pt was offered US doppler, but he declined today and will follow up for [place] if pain is not improved by Monday. Red flag symptoms reviewed with patient and strict return precautions given.    I evaluated patient, was consulted regarding treatment, and agree with assessment and plan per Denice Bors RN, FNP Student   Audria Nine, DNP, AGNP-C

## 2023-10-25 NOTE — Progress Notes (Signed)
 Acute Office Visit  Subjective:     Patient ID: Joseph Whitaker, male    DOB: April 10, 1946, 78 y.o.   MRN: [id number]  Chief Complaint  Patient presents with   Back Pain    Pt complains of lower back pain that started on 2/4. Pt complains that the back pain radiates to his groin area. Dull achy pain. Pt states that its hard to sit and says he has to eat standing. Pt also complains hurts to strain when urinating. Takes longer for him to urinate. Taking OTC aleve for pain.    Testicle Pain    HPI:   Patient is in today for R sided lumbar pain that is described as dull with radiation to the groin.  He has a hx of HTN, HLD, BPH, kidney stones and hypothyroidism. Pt state that pain started when he lifted something heavy. The pain is aggravated by sitting and when straining to have a BM. The pain is alleviated by lying down. The pain is better first thing in the morning and gradually worsens throughout the day. The pain has been gradually become more severe and 4 days ago the R testicle became painful without swelling. Pt is taking Aleve every 12 hours with moderate relief. The pain does not radiate down legs. He denies any new trouble with urination, hematuria, dysuria, fever, or abd pain.   Review of Systems  Constitutional:  Negative for chills, fever and weight loss.  Respiratory:  Negative for shortness of breath.   Cardiovascular:  Negative for chest pain and palpitations.  Gastrointestinal:  Negative for abdominal pain, nausea and vomiting.  Musculoskeletal:  Positive for back pain. Negative for myalgias.  Neurological:  Negative for dizziness and headaches.        Objective:    BP (!) 140/80   Pulse 69   Temp 98.3 F (36.8 C) (Oral)   Ht 5\' 9"  (1.753 m)   Wt 81.4 kg   SpO2 95%   BMI 26.49 kg/m  BP Readings from Last 3 Encounters:  10/25/23 (!) 140/80  06/07/23 134/76  03/21/23 116/70   Wt Readings from Last 3 Encounters:  10/25/23 81.4 kg  10/01/23 79.4 kg  06/07/23  79.4 kg      Physical Exam Exam conducted with a chaperone present Hadassah Pais, CMA).  Constitutional:      Appearance: Normal appearance. He is not ill-appearing.  Cardiovascular:     Rate and Rhythm: Normal rate and regular rhythm.     Pulses: Normal pulses.     Heart sounds: Normal heart sounds. No murmur heard.    No friction rub. No gallop.  Pulmonary:     Effort: Pulmonary effort is normal.     Breath sounds: Normal breath sounds.  Abdominal:     General: Bowel sounds are normal.     Palpations: Abdomen is soft. There is no mass.     Tenderness: There is no abdominal tenderness.     Hernia: No hernia is present. There is no hernia in the left inguinal area or right inguinal area.  Genitourinary:    Testes:        Right: Tenderness present. Mass or swelling not present.        Left: Mass, tenderness or swelling not present.     Comments: R side testicle not high riding in scrotum.  Musculoskeletal:     Lumbar back: Negative right straight leg raise test and negative left straight leg raise test.  Back:     Comments: Lower extremity strength 5/5 bilaterally.   Lymphadenopathy:     Lower Body: No right inguinal adenopathy. No left inguinal adenopathy.  Neurological:     Mental Status: He is alert.     Sensory: Sensation is intact.     Deep Tendon Reflexes:     Reflex Scores:      Patellar reflexes are 2+ on the right side and 2+ on the left side.    No results found for any visits on 10/25/23.      Assessment & Plan:   Problem List Items Addressed This Visit     Right lumbar pain - Primary   Given the onset after lifting and pain with certain positions this is likely lumbar radiculopathy. Other differential considered, but not limited to nephrolithiasis, inguinal hernia, epididymitis, orchitis, prostatitis, and other intraabdominal etiology. POCT urine dip was reviewed and there was no hematuria, nitrites, or leukocytes noted. Proteinuria was present.   Methylprednisolone 40 mg IM injection administered in office.  Methocarbamol 500 mg twice daily PRN for muscle spasms. Pt educated about sedative side effects of medication. Strict return precautions and fall precautions given.       Relevant Medications   methylPREDNISolone acetate (DEPO-MEDROL) injection 40 mg   methocarbamol (ROBAXIN) 500 MG tablet   Testicular pain, right   POCT urine dip was reviewed and there was no hematuria, nitrites, or leukocytes noted. Proteinuria was present. Given the testicular tenderness pt was offered US doppler, but he declined today and will follow up for [place] if pain is not improved by Monday. Red flag symptoms reviewed with patient and strict return precautions given.        Relevant Orders   POCT urinalysis dipstick   Abnormal urinalysis   POCT urine dip was reviewed and there was no hematuria, nitrites, or leukocytes noted. Proteinuria was present. Urine culture pending      Relevant Orders   Urine Culture    Meds ordered this encounter  Medications   methylPREDNISolone acetate (DEPO-MEDROL) injection 40 mg   methocarbamol (ROBAXIN) 500 MG tablet    Sig: Take 1 tablet (500 mg total) by mouth 2 (two) times daily as needed for muscle spasms.    Dispense:  20 tablet    Refill:  0    Return if symptoms worsen or fail to improve.  Murvin Donning, RN

## 2023-10-26 ENCOUNTER — Emergency Department (HOSPITAL_BASED_OUTPATIENT_CLINIC_OR_DEPARTMENT_OTHER): Payer: Medicare Other

## 2023-10-26 ENCOUNTER — Emergency Department (HOSPITAL_BASED_OUTPATIENT_CLINIC_OR_DEPARTMENT_OTHER)
Admission: EM | Admit: 2023-10-26 | Discharge: 2023-10-26 | Disposition: A | Discharge status: Home / Self Care | Payer: Medicare Other

## 2023-10-26 DIAGNOSIS — M5416 Radiculopathy, lumbar region: Secondary | ICD-10-CM | POA: Insufficient documentation

## 2023-10-26 DIAGNOSIS — R109 Unspecified abdominal pain: Secondary | ICD-10-CM | POA: Diagnosis not present

## 2023-10-26 DIAGNOSIS — Z79899 Other long term (current) drug therapy: Secondary | ICD-10-CM | POA: Insufficient documentation

## 2023-10-26 DIAGNOSIS — N400 Enlarged prostate without lower urinary tract symptoms: Secondary | ICD-10-CM | POA: Diagnosis not present

## 2023-10-26 DIAGNOSIS — K8020 Calculus of gallbladder without cholecystitis without obstruction: Secondary | ICD-10-CM | POA: Insufficient documentation

## 2023-10-26 DIAGNOSIS — K5730 Diverticulosis of large intestine without perforation or abscess without bleeding: Secondary | ICD-10-CM | POA: Insufficient documentation

## 2023-10-26 DIAGNOSIS — K7689 Other specified diseases of liver: Secondary | ICD-10-CM | POA: Diagnosis not present

## 2023-10-26 DIAGNOSIS — N433 Hydrocele, unspecified: Secondary | ICD-10-CM | POA: Diagnosis not present

## 2023-10-26 DIAGNOSIS — I10 Essential (primary) hypertension: Secondary | ICD-10-CM | POA: Insufficient documentation

## 2023-10-26 DIAGNOSIS — M549 Dorsalgia, unspecified: Secondary | ICD-10-CM | POA: Diagnosis present

## 2023-10-26 LAB — URINE CULTURE
MICRO NUMBER:: 16113263
SPECIMEN QUALITY:: ADEQUATE

## 2023-10-26 LAB — URINALYSIS, ROUTINE W REFLEX MICROSCOPIC
Bacteria, UA: NONE SEEN
Bilirubin Urine: NEGATIVE
Glucose, UA: NEGATIVE mg/dL
Hgb urine dipstick: NEGATIVE
Ketones, ur: NEGATIVE mg/dL
Leukocytes,Ua: NEGATIVE
Nitrite: NEGATIVE
Protein, ur: NEGATIVE mg/dL
Specific Gravity, Urine: 1.024 (ref 1.005–1.030)
pH: 5.5 (ref 5.0–8.0)

## 2023-10-26 MED ORDER — HYDROCODONE-ACETAMINOPHEN 5-325 MG PO TABS: 1.0000 | ORAL_TABLET | Freq: Four times a day (QID) | ORAL | 0 refills | Status: AC | PRN

## 2023-10-26 NOTE — ED Notes (Signed)
 RN reviewed discharge instructions with pt. Pt verbalized understanding and had no further questions. VSS upon discharge.

## 2023-10-26 NOTE — ED Notes (Signed)
 Pt has urine collection cup

## 2023-10-26 NOTE — Discharge Instructions (Addendum)
 Please use Tylenol or ibuprofen for pain.  You may use 600 mg ibuprofen every 6 hours or 1000 mg of Tylenol every 6 hours.  You may choose to alternate between the 2.  This would be most effective.  Not to exceed 4 g of Tylenol within 24 hours.  Not to exceed 3200 mg ibuprofen 24 hours.  You can use the muscle relaxant I you were already prescribed in addition to the above to help with any breakthrough pain.  You can take it up to twice daily.  It is safe to take at night, but I would be cautious taking it during the day as it can cause some drowsiness.  Make sure that you are feeling awake and alert before you get behind the wheel of a car or operate a motor vehicle.  It is not a narcotic pain medication so you are able to take it if it is not making you drowsy and still pilot a vehicle or machinery safely.  You can use the stronger narcotic pain medication in place of Tylenol for severe break through pain.  If you take the narcotic pain medication that we prescribed, I recommend that you also take a laxative such as MiraLAX or Dulcolax every day that you take the narcotic pain medicine, and drink plenty of fluids, 50 to 64 ounces to prevent any constipation.

## 2023-10-26 NOTE — ED Triage Notes (Signed)
 Pt c/o RT side groin pain x 2 days radiating to RT lower back after "I hurt my back". Endorse back injury x 2 weeks pta after heavy lifting. Has been tx, currently taking robaxin and aleve. Robaxin at 1045 and aleve at 0930

## 2023-10-26 NOTE — ED Provider Notes (Signed)
 [place] EMERGENCY DEPARTMENT AT [facility] Provider Note   CSN: [id number] Arrival date & time: 10/26/23  1217     History  Chief Complaint  Patient presents with   Back Pain    Joseph Whitaker is a 78 y.o. male with past medical history seen for hypertension, hyperlipidemia, history of kidney stone who presents with concern for right lower back pain radiating towards the right groin for 2 weeks, reporting no significant improvement despite taking Aleve twice daily.  Was just prescribed a muscle relaxant and a steroid shot by PCP yesterday.  Told to come to the ED if symptoms worsened or did not improve for "a scan".  He denies any new numbness, tingling, loss control of bladder, bowels.   Back Pain      Home Medications Prior to Admission medications   Medication Sig Start Date End Date Taking? Authorizing Provider  HYDROcodone-acetaminophen (NORCO/VICODIN) 5-325 MG tablet Take 1 tablet by mouth every 6 (six) hours as needed. 10/26/23  Yes Kery Batzel H, PA-C  amLODipine (NORVASC) 5 MG tablet TAKE 1 TABLET(5 MG) BY MOUTH DAILY 10/31/22   Drubel, Lillia Abed, PA-C  augmented betamethasone dipropionate (DIPROLENE-AF) 0.05 % cream Apply topically 2 (two) times daily. 09/28/22   [provider]  cetirizine (ZYRTEC) 10 MG tablet Take 1 tablet (10 mg total) by mouth daily. 06/07/23   Eden Emms, NP  fluticasone (FLONASE) 50 MCG/ACT nasal spray Place 2 sprays into both nostrils daily. 06/07/23   Eden Emms, NP  levothyroxine (SYNTHROID) 50 MCG tablet TAKE 1 TABLET(50 MCG) BY MOUTH DAILY BEFORE BREAKFAST 11/08/22   Drubel, Lillia Abed, PA-C  methocarbamol (ROBAXIN) 500 MG tablet Take 1 tablet (500 mg total) by mouth 2 (two) times daily as needed for muscle spasms. 10/25/23   Eden Emms, NP  Misc Natural Products (CHOLESTEROL SUPPORT PO) Take 1 tablet by mouth 2 (two) times daily. Chlor Support    [provider]  tamsulosin (FLOMAX) 0.4 MG CAPS capsule  Take 1 capsule (0.4 mg total) by mouth daily. 06/17/23   Eden Emms, NP  traZODone (DESYREL) 50 MG tablet Take 0.5-1 tablets (25-50 mg total) by mouth at bedtime as needed for sleep. 06/07/23   Eden Emms, NP      Allergies    Red yeast rice [cholestin] and Statins    Review of Systems   Review of Systems  Musculoskeletal:  Positive for back pain.  All other systems reviewed and are negative.   Physical Exam Updated Vital Signs BP (!) 150/86   Pulse 69   Temp 98.2 F (36.8 C)   Resp 18   SpO2 97%  Physical Exam Vitals and nursing note reviewed.  Constitutional:      General: He is not in acute distress.    Appearance: Normal appearance.  HENT:     Head: Normocephalic and atraumatic.  Eyes:     General:        Right eye: No discharge.        Left eye: No discharge.  Cardiovascular:     Rate and Rhythm: Normal rate and regular rhythm.     Heart sounds: No murmur heard.    No friction rub. No gallop.  Pulmonary:     Effort: Pulmonary effort is normal.     Breath sounds: Normal breath sounds.  Abdominal:     General: Bowel sounds are normal.     Palpations: Abdomen is soft.  Genitourinary:    Comments: Normal  appearance of testes, intact cremasteric reflex, no palpable hernia Musculoskeletal:     Comments: Focal tenderness to palpation in the right lumbar paraspinous muscles, no significant midline spinal tenderness.  Skin:    General: Skin is warm and dry.     Capillary Refill: Capillary refill takes less than 2 seconds.  Neurological:     Mental Status: He is alert and oriented to person, place, and time.  Psychiatric:        Mood and Affect: Mood normal.        Behavior: Behavior normal.     ED Results / Procedures / Treatments   Labs (all labs ordered are listed, but only abnormal results are displayed) Labs Reviewed  URINALYSIS, ROUTINE W REFLEX MICROSCOPIC    EKG None  Radiology US SCROTUM W/DOPPLER Result Date: 10/26/2023 CLINICAL DATA:   Ten day history of right flank pain radiating into the right testicle EXAM: SCROTAL ULTRASOUND DOPPLER ULTRASOUND OF THE TESTICLES TECHNIQUE: Complete ultrasound examination of the testicles, epididymis, and other scrotal structures was performed. Color and spectral Doppler ultrasound were also utilized to evaluate blood flow to the testicles. COMPARISON:  Same day CT abdomen and pelvis FINDINGS: Right testicle Measurements: 4.1 x 3.3 x 2.4 cm, 16.2 mL. No mass or microlithiasis visualized. Tubular ectasia of the rete testis. Left testicle Measurements: 3.8 x 3.0 x 2.2 cm, 12.8 mL. No mass or microlithiasis visualized. Tubular ectasia of the rete testis. Right epididymis:  Normal in size and appearance. Left epididymis:  Normal in size and appearance. Hydrocele:  Small left hydrocele Varicocele:  None visualized. Pulsed Doppler interrogation of both testes demonstrates normal low resistance arterial and venous waveforms bilaterally. IMPRESSION: 1. No evidence of testicular torsion. 2. Small left hydrocele. Electronically Signed   By: Agustin Cree M.D.   On: 10/26/2023 15:35   CT Renal Stone Study Result Date: 10/26/2023 CLINICAL DATA:  Abdominal and flank pain. EXAM: CT ABDOMEN AND PELVIS WITHOUT CONTRAST TECHNIQUE: Multidetector CT imaging of the abdomen and pelvis was performed following the standard protocol without IV contrast. RADIATION DOSE REDUCTION: This exam was performed according to the departmental dose-optimization program which includes automated exposure control, adjustment of the mA and/or kV according to patient size and/or use of iterative reconstruction technique. COMPARISON:  None Available. FINDINGS: Lower chest: No acute findings. Hepatobiliary: Several small cysts are seen in both the right and left hepatic lobes. Tiny less than 5 mm calcified gallstone noted. No signs of cholecystitis or biliary ductal dilatation. Pancreas: No mass or inflammatory process visualized on this unenhanced exam.  Spleen:  Within normal limits in size. Adrenals/Urinary tract: No evidence of urolithiasis or hydronephrosis. Left renal sinus cysts incidentally noted. Unremarkable unopacified urinary bladder. Stomach/Bowel: No evidence of obstruction, inflammatory process, or abnormal fluid collections. Normal appendix visualized. Diverticulosis is seen mainly involving the sigmoid colon, however there is no evidence of diverticulitis. Vascular/Lymphatic: No pathologically enlarged lymph nodes identified. No evidence of abdominal aortic aneurysm. Reproductive:  Mildly enlarged prostate. Other:  None. Musculoskeletal: No suspicious bone lesions identified. Bipolar left hip prosthesis is seen. IMPRESSION: No evidence of urolithiasis, hydronephrosis, or other acute findings. Colonic diverticulosis, without radiographic evidence of diverticulitis. Mildly enlarged prostate. Cholelithiasis. No radiographic evidence of cholecystitis. Electronically Signed   By: Danae Orleans M.D.   On: 10/26/2023 15:33    Procedures Procedures    Medications Ordered in ED Medications - No data to display  ED Course/ Medical Decision Making/ A&P  Medical Decision Making Amount and/or Complexity of Data Reviewed Labs: ordered.   This patient is a 78 y.o. male  who presents to the ED for concern of back pain, groin pain.   Differential diagnoses prior to evaluation: The emergent differential diagnosis includes, but is not limited to,  hydrocele, varicocele, testicular torsion, epididymitis, epididymal appendage injury, inguinal hernia, traumatic injury of testicle, STI, inguinal lymphadenopathy versus other, most suspicious of sciatica, lumbar strain with pain radiating to the groin from this location. . This is not an exhaustive differential.   Past Medical History / Co-morbidities / Social History: hypertension, hyperlipidemia, history of kidney stone   Physical Exam: Physical exam performed. The  pertinent findings include: Focal tenderness in the right lumbar paraspinous muscles, positive straight leg raise on the right.  Intact strength 5/5 of bilateral upper and lower extremities.  Normal appearance of external genitalia, scrotum.  Vital signs overall stable other than mild hypertension, blood pressure 153/89.  Lab Tests/Imaging studies: I personally interpreted labs/imaging and the pertinent results include: UA unremarkable, independently interpreted CT renal stone study and ultrasound of the scrotum with Doppler which showed no evidence of acute intra-abdominal abnormality, no evidence of acute testicular pathology.  Overall with high clinical suspicion for musculoskeletal back pain with sciatica, I think that plan for muscle relaxant, recent steroid injection is appropriate, encourage close orthopedic follow-up for MRI and further eval if pain not improved.. I agree with the radiologist interpretation.   Medications: Encouraged ongoing Aleve, Tylenol, muscle relaxant at home, will prescribe short course of Norco for severe breakthrough pain, encourage close orthopedic follow-up for repeat evaluation, possible MRI.   Disposition: After consideration of the diagnostic results and the patients response to treatment, I feel that patient is stable for discharge with plan as above, findings consistent with lumbar back pain with radiculopathy, likely sciatica.   emergency department workup does not suggest an emergent condition requiring admission or immediate intervention beyond what has been performed at this time. The plan is: as above. The patient is safe for discharge and has been instructed to return immediately for worsening symptoms, change in symptoms or any other concerns.  Final Clinical Impression(s) / ED Diagnoses Final diagnoses:  Lumbar back pain with radiculopathy affecting right lower extremity    Rx / DC Orders ED Discharge Orders          Ordered     HYDROcodone-acetaminophen (NORCO/VICODIN) 5-325 MG tablet  Every 6 hours PRN        10/26/23 1559              Luqman Perrelli, [place] H, PA-C 10/26/23 1559    Coral Spikes, DO 10/26/23 1610

## 2023-10-27 ENCOUNTER — Encounter: Payer: Self-pay | Admitting: Nurse Practitioner

## 2023-10-28 ENCOUNTER — Other Ambulatory Visit: Payer: Self-pay | Admitting: Nurse Practitioner

## 2023-10-28 DIAGNOSIS — E039 Hypothyroidism, unspecified: Secondary | ICD-10-CM

## 2023-10-28 DIAGNOSIS — I10 Essential (primary) hypertension: Secondary | ICD-10-CM

## 2023-10-28 MED ORDER — AMLODIPINE BESYLATE 5 MG PO TABS: 5.0000 mg | ORAL_TABLET | Freq: Every day | ORAL | 3 refills | Status: AC

## 2023-10-28 MED ORDER — LEVOTHYROXINE SODIUM 50 MCG PO TABS: 50.0000 ug | ORAL_TABLET | Freq: Every day | ORAL | 3 refills | Status: AC

## 2023-10-28 NOTE — Telephone Encounter (Addendum)
 Called patient states he is doing much better. He had ultrasound over the weekend. He also requested refill on his blood pressure medication and thyroid medication. I have pended and verified pharmacy. This will be first refill of these from our office.

## 2023-10-28 NOTE — Telephone Encounter (Signed)
-----   Message from [facility] sent at 10/25/2023 11:05 AM EST ----- Regarding: Testicular pain See if steroids and muscle relaxer helped. IF not consider scrotal US

## 2023-10-28 NOTE — Telephone Encounter (Signed)
 Can we see if the medicaitons given have helped any. If he is still having pain in the testicle. Ask where he would like to have it done. [place] or [place]

## 2023-10-28 NOTE — Addendum Note (Signed)
 Addended by: Donnamarie Poag on: 10/28/2023 09:24 AM   Modules accepted: Orders

## 2023-10-29 ENCOUNTER — Telehealth: Payer: Self-pay | Admitting: Pharmacy Technician

## 2023-10-29 ENCOUNTER — Other Ambulatory Visit (HOSPITAL_COMMUNITY): Payer: Self-pay

## 2023-10-29 NOTE — Telephone Encounter (Signed)
 Pharmacy Patient Advocate Encounter   Received notification from CoverMyMeds that prior authorization for METHOCARBAMOL 500MG  TABLETS is required/requested.   Insurance verification completed.   The patient is insured through [employer] .   Per test claim: PA required; PA submitted to above mentioned insurance via CoverMyMeds Key/confirmation #/EOC [id number] Status is pending

## 2023-10-29 NOTE — Telephone Encounter (Signed)
 Pharmacy Patient Advocate Encounter  Received notification from [employer] that Prior Authorization for METHOCARBAMOL 500MG  TABLETS has been APPROVED from 10/29/2023 to 10/27/2024. Ran test claim, Copay is $0.91. This test claim was processed through [facility]- copay amounts may vary at other pharmacies due to pharmacy/plan contracts, or as the patient moves through the different stages of their insurance plan.   PA #/Case ID/Reference #: [id number]

## 2023-10-30 ENCOUNTER — Other Ambulatory Visit: Payer: Self-pay | Admitting: Physician Assistant

## 2023-10-30 DIAGNOSIS — I10 Essential (primary) hypertension: Secondary | ICD-10-CM

## 2023-12-06 ENCOUNTER — Ambulatory Visit (INDEPENDENT_AMBULATORY_CARE_PROVIDER_SITE_OTHER): Payer: Medicare Other | Admitting: Nurse Practitioner

## 2023-12-06 VITALS — BP 142/80 | HR 75 | Temp 98.2°F | Ht 69.0 in | Wt 176.8 lb

## 2023-12-06 DIAGNOSIS — I10 Essential (primary) hypertension: Secondary | ICD-10-CM

## 2023-12-06 DIAGNOSIS — G479 Sleep disorder, unspecified: Secondary | ICD-10-CM

## 2023-12-06 DIAGNOSIS — E039 Hypothyroidism, unspecified: Secondary | ICD-10-CM | POA: Diagnosis not present

## 2023-12-06 DIAGNOSIS — R350 Frequency of micturition: Secondary | ICD-10-CM

## 2023-12-06 DIAGNOSIS — N401 Enlarged prostate with lower urinary tract symptoms: Secondary | ICD-10-CM

## 2023-12-06 MED ORDER — TAMSULOSIN HCL 0.4 MG PO CAPS: 0.4000 mg | ORAL_CAPSULE | Freq: Every day | ORAL | 2 refills | Status: DC

## 2023-12-06 NOTE — Progress Notes (Signed)
 Established Patient Office Visit  Subjective   Patient ID: Joseph Whitaker, male    DOB: 1945-10-05  Age: 78 y.o. MRN: [id number]  Chief Complaint  Patient presents with   Follow-up    Pt complains of recent BP readings in the 120s/130s over 70/80.     Medication Refill    Tamsulosin     HPI  HTN: patient is currently maintained on amlodipine 5mg  daily. He is checking his blood pressure at home.  Patient does check his blood pressure at home on occasion.  He is getting systolic readings of 120s to 130s and diastolic readings of 70s to 80s.  BPH: patient is currently maintained on tamsulosin 0.4mg  daily.  Last office that he was complaining about some urinary hesitancy since then he has not had any urinary issues.  He is tolerating the medication well.  Hypothyroidism: he is currently maintained on levothyroxine and taking it in the morning on an empty stomach   Insomnia: at last office visit he was started on 0.5-1 tab of trazodone 50mg  to help with sleep. He was trying melatonin at the last visit. He was having more trouble staying asleep.  Patient states trazodone has been effective.  He does not take it every night but only as needed.  He does go to bed around 10 PM and gets up around 7 AM. Wake sup feeling rested most days and does not feel groggy with trazodone use     Review of Systems  Constitutional:  Negative for chills and fever.  Respiratory:  Negative for shortness of breath.   Cardiovascular:  Negative for chest pain and leg swelling.  Neurological:  Negative for dizziness and headaches.      Objective:     BP (!) 142/80   Pulse 75   Temp 98.2 F (36.8 C) (Oral)   Ht 5\' 9"  (1.753 m)   Wt 176 lb 12.8 oz (80.2 kg)   SpO2 97%   BMI 26.11 kg/m    Physical Exam Vitals and nursing note reviewed.  Constitutional:      Appearance: Normal appearance.  Cardiovascular:     Rate and Rhythm: Normal rate and regular rhythm.     Heart sounds: Normal heart  sounds.  Pulmonary:     Effort: Pulmonary effort is normal.     Breath sounds: Normal breath sounds.  Neurological:     Mental Status: He is alert.      No results found for any visits on 12/06/23.    The 10-year ASCVD risk score (Arnett DK, et al., 2019) is: 34.7%    Assessment & Plan:   Problem List Items Addressed This Visit       Cardiovascular and Mediastinum   Essential (primary) hypertension - Primary   Maintained on amlodipine 5 mg daily. Ambulatory readings are well controlled despite borderline reading in office today. No changes will be made at this time.   I evaluated patient, was consulted regarding treatment, and agree with assessment and plan per Denice Bors RN, FNP Student   Audria Nine, DNP, AGNP-C         Endocrine   Hypothyroidism   Maintained on levothyroxine 50 mcg every morning.  Condition stable.   I evaluated patient, was consulted regarding treatment, and agree with assessment and plan per Denice Bors RN, FNP Student   Audria Nine, DNP, AGNP-C         Other   Benign prostatic hyperplasia with urinary frequency   Maintained  on flomax 0.4mg  daily.  I evaluated patient, was consulted regarding treatment, and agree with assessment and plan per Denice Bors RN, FNP Student   Audria Nine, DNP, AGNP-C       Relevant Medications   tamsulosin (FLOMAX) 0.4 MG CAPS capsule   Sleep disturbance   Trazodone 50 mg nightly PRN for sleep. Sleep has improved with PRN trazodone. Will continue therapy.   I evaluated patient, was consulted regarding treatment, and agree with assessment and plan per Denice Bors RN, FNP Student   Audria Nine, DNP, AGNP-C        Return in about 6 months (around 06/06/2024) for CPE.    Audria Nine, NP

## 2023-12-06 NOTE — Assessment & Plan Note (Addendum)
 Maintained on levothyroxine 50 mcg every morning.  Condition stable.   I evaluated patient, was consulted regarding treatment, and agree with assessment and plan per Denice Bors RN, FNP Student   Audria Nine, DNP, AGNP-C

## 2023-12-06 NOTE — Assessment & Plan Note (Addendum)
 Maintained on amlodipine 5 mg daily. Ambulatory readings are well controlled despite borderline reading in office today. No changes will be made at this time.   I evaluated patient, was consulted regarding treatment, and agree with assessment and plan per Denice Bors RN, FNP Student   Audria Nine, DNP, AGNP-C

## 2023-12-06 NOTE — Patient Instructions (Signed)
 It was a pleasure seeing you today.  A refill for Flomax was sent in to your pharmacy.  We will see you in 6 months. You will need to be fasting at that time for labs.

## 2023-12-06 NOTE — Progress Notes (Signed)
 Established Patient Office Visit  Subjective   Patient ID: Joseph Whitaker, male    DOB: 06-Dec-1945  Age: 78 y.o. MRN: [id number]  Chief Complaint  Patient presents with   Follow-up    Pt complains of recent BP readings in the 120s/130s over 70/80.     Medication Refill    Tamsulosin       HTN: He is maintained on amlodipine 5 mg daily. He checks is BP at home PRN with an arm cuff. He reports recent systolic of 120-130 and a diastolic from 70-80. He denies any HA, dizziness, CP, or peripheral swelling.   Hypothyroidism: Maintained on levothyroxine 50 mcg daily that he takes first thing in the AM.   BPH: He is maintained on Flomax 0.4 mg daily. During visit last October he reported increasing difficulty in starting his stream. He is no longer having any issues with hesitancy or urinary frequency.   Sleep: He reports improvement in his sleep. He was placed on trazodone 50 mg nightly. He does not take it every night, but only when he does not feel like he will be able to sleep. He goes to bed around 10 and gets up at 7. He wakes up once to void in the middle of the night. He wakes up in the AM feeling rested without feeling groggy. He has had no gait instability or falls after taking trazodone.       Review of Systems  Constitutional:  Negative for chills, fever and weight loss.  Respiratory:  Negative for shortness of breath.   Cardiovascular:  Negative for chest pain and palpitations.  Gastrointestinal:  Negative for abdominal pain, nausea and vomiting.  Musculoskeletal:  Negative for myalgias.  Neurological:  Negative for dizziness and headaches.  Psychiatric/Behavioral:  Negative for depression and suicidal ideas.       Objective:     BP (!) 142/80   Pulse 75   Temp 98.2 F (36.8 C) (Oral)   Ht 5\' 9"  (1.753 m)   Wt 80.2 kg   SpO2 97%   BMI 26.11 kg/m  BP Readings from Last 3 Encounters:  12/06/23 (!) 142/80  10/26/23 (!) 150/86  10/25/23 138/80   Wt Readings  from Last 3 Encounters:  12/06/23 80.2 kg  10/25/23 81.4 kg  10/01/23 79.4 kg      Physical Exam Constitutional:      Appearance: Normal appearance. He is not ill-appearing.  Cardiovascular:     Rate and Rhythm: Normal rate and regular rhythm.     Pulses: Normal pulses.     Heart sounds: Normal heart sounds. No murmur heard.    No friction rub. No gallop.  Pulmonary:     Effort: Pulmonary effort is normal.     Breath sounds: Normal breath sounds.  Neurological:     Mental Status: He is alert and oriented to person, place, and time.     Deep Tendon Reflexes:     Reflex Scores:      Bicep reflexes are 1+ on the right side and 1+ on the left side.      Patellar reflexes are 1+ on the right side and 1+ on the left side.    Comments: Strength to bilateral upper and lower extremities 5/5      No results found for any visits on 12/06/23.    The 10-year ASCVD risk score (Arnett DK, et al., 2019) is: 34.7%    Assessment & Plan:   Problem List Items Addressed This  Visit     Essential (primary) hypertension - Primary   Maintained on amlodipine 5 mg daily. Ambulatory readings are well controlled despite borderline reading in office today. No changes will be made at this time.       Hypothyroidism   Maintained on levothyroxine 50 mcg every morning.  Condition stable.       Benign prostatic hyperplasia with urinary frequency   Maintained on flomax 0.4mg  daily.      Relevant Medications   tamsulosin (FLOMAX) 0.4 MG CAPS capsule   Sleep disturbance   Trazodone 50 mg nightly PRN for sleep. Sleep has improved with PRN trazodone. Will continue therapy.        Return in about 6 months (around 06/06/2024) for CPE.    Murvin Donning, RN

## 2023-12-06 NOTE — Assessment & Plan Note (Addendum)
 Trazodone 50 mg nightly PRN for sleep. Sleep has improved with PRN trazodone. Will continue therapy.   I evaluated patient, was consulted regarding treatment, and agree with assessment and plan per Denice Bors RN, FNP Student   Audria Nine, DNP, AGNP-C

## 2023-12-06 NOTE — Assessment & Plan Note (Addendum)
 Maintained on flomax 0.4mg  daily.  I evaluated patient, was consulted regarding treatment, and agree with assessment and plan per Denice Bors RN, FNP Student   Audria Nine, DNP, AGNP-C

## 2024-01-20 DIAGNOSIS — C44311 Basal cell carcinoma of skin of nose: Secondary | ICD-10-CM | POA: Diagnosis not present

## 2024-01-20 DIAGNOSIS — L814 Other melanin hyperpigmentation: Secondary | ICD-10-CM | POA: Diagnosis not present

## 2024-01-20 DIAGNOSIS — L578 Other skin changes due to chronic exposure to nonionizing radiation: Secondary | ICD-10-CM | POA: Diagnosis not present

## 2024-01-20 DIAGNOSIS — Z85828 Personal history of other malignant neoplasm of skin: Secondary | ICD-10-CM | POA: Diagnosis not present

## 2024-01-23 DIAGNOSIS — D2262 Melanocytic nevi of left upper limb, including shoulder: Secondary | ICD-10-CM | POA: Diagnosis not present

## 2024-01-23 DIAGNOSIS — D2271 Melanocytic nevi of right lower limb, including hip: Secondary | ICD-10-CM | POA: Diagnosis not present

## 2024-01-23 DIAGNOSIS — D225 Melanocytic nevi of trunk: Secondary | ICD-10-CM | POA: Diagnosis not present

## 2024-01-23 DIAGNOSIS — L821 Other seborrheic keratosis: Secondary | ICD-10-CM | POA: Diagnosis not present

## 2024-01-23 DIAGNOSIS — D2272 Melanocytic nevi of left lower limb, including hip: Secondary | ICD-10-CM | POA: Diagnosis not present

## 2024-01-23 DIAGNOSIS — Z85820 Personal history of malignant melanoma of skin: Secondary | ICD-10-CM | POA: Diagnosis not present

## 2024-01-23 DIAGNOSIS — L309 Dermatitis, unspecified: Secondary | ICD-10-CM | POA: Diagnosis not present

## 2024-01-23 DIAGNOSIS — Z08 Encounter for follow-up examination after completed treatment for malignant neoplasm: Secondary | ICD-10-CM | POA: Diagnosis not present

## 2024-01-23 DIAGNOSIS — Z85828 Personal history of other malignant neoplasm of skin: Secondary | ICD-10-CM | POA: Diagnosis not present

## 2024-01-23 DIAGNOSIS — D2261 Melanocytic nevi of right upper limb, including shoulder: Secondary | ICD-10-CM | POA: Diagnosis not present

## 2024-01-31 DIAGNOSIS — Z961 Presence of intraocular lens: Secondary | ICD-10-CM | POA: Diagnosis not present

## 2024-01-31 DIAGNOSIS — T1502XA Foreign body in cornea, left eye, initial encounter: Secondary | ICD-10-CM | POA: Diagnosis not present

## 2024-03-27 ENCOUNTER — Ambulatory Visit (INDEPENDENT_AMBULATORY_CARE_PROVIDER_SITE_OTHER): Admitting: Nurse Practitioner

## 2024-03-27 VITALS — BP 118/62 | HR 77 | Temp 98.5°F | Ht 69.0 in | Wt 175.4 lb

## 2024-03-27 DIAGNOSIS — Z131 Encounter for screening for diabetes mellitus: Secondary | ICD-10-CM | POA: Diagnosis not present

## 2024-03-27 DIAGNOSIS — R531 Weakness: Secondary | ICD-10-CM

## 2024-03-27 NOTE — Patient Instructions (Signed)
 EKG (heart tracing) looked good in office I will be in touch with the lab results once I have reviewed them  Try and pay attention next time to see what you have done/eaten when an episode occurs  If you develop chest pain/shortness of breath be seen immediately

## 2024-03-27 NOTE — Progress Notes (Signed)
 Acute Office Visit  Subjective:     Patient ID: Joseph Whitaker, male    DOB: [birth year], 78 y.o.   MRN: [id number]  Chief Complaint  Patient presents with   Dizziness    Pt complains of dizzy spells followed by weakness and stomach pains that last a few minutes. Pt has one once a month for the last 6 months. Pt requests for an EKG .     HPI Patient is in today for dizziness with a history of Hypertension, hypothyroidism, HLD, tobacco abuse  States that he is having spells monthly for the past 6 months. States that he has had this episodes while laying down waiting to go to sleep and when working.  States that when the episodes happen he feels like his energy is drained, stomach  and feel like he needs a BM. States that it will last less than a minute.   States that he has noticed it can be associated with food. He has noticed that it if he has sugar it can be exacerbated   States that the most recent episode occurred this past Tuesday  Review of Systems  Constitutional:  Negative for chills and fever.  Respiratory:  Negative for shortness of breath.   Cardiovascular:  Negative for chest pain and palpitations.  Gastrointestinal:  Positive for abdominal pain. Negative for constipation, diarrhea and nausea.  Neurological:  Positive for weakness. Negative for dizziness, tingling and headaches.        Objective:    BP 118/62   Pulse 77   Temp 98.5 F (36.9 C) (Oral)   Ht 5' 9 (1.753 m)   Wt 175 lb 6.4 oz (79.6 kg)   SpO2 94%   BMI 25.90 kg/m    Physical Exam Vitals and nursing note reviewed.  Constitutional:      Appearance: Normal appearance.  HENT:     Right Ear: Tympanic membrane, ear canal and external ear normal.     Left Ear: Tympanic membrane, ear canal and external ear normal.     Mouth/Throat:     Mouth: Mucous membranes are moist.     Pharynx: Oropharynx is clear.  Cardiovascular:     Rate and Rhythm: Normal rate and regular rhythm.     Heart sounds:  Normal heart sounds.  Pulmonary:     Effort: Pulmonary effort is normal.     Breath sounds: Normal breath sounds.  Abdominal:     General: Bowel sounds are normal. There is no distension.     Palpations: There is no mass.     Tenderness: There is no abdominal tenderness.     Hernia: No hernia is present.  Lymphadenopathy:     Cervical: No cervical adenopathy.  Neurological:     General: No focal deficit present.     Mental Status: He is alert.     Comments: Bilateral upper and lower extremity strength 5/5     No results found for any visits on 03/27/24.      Assessment & Plan:   Problem List Items Addressed This Visit       Other   Weakness - Primary   Ambiguous in nature, EKG wnl. Will check CBC, CMP, A1C, TSH. Query gallbladder involvement given stomach pain and then sometimes a BM after. CT renal did show a calcified gallstone. Ed precautions reviewed       Relevant Orders   EKG 12-Lead   CBC   Comprehensive metabolic panel with GFR   Hemoglobin  A1c   TSH   Other Visit Diagnoses       Screening for diabetes mellitus       Relevant Orders   Hemoglobin A1c       No orders of the defined types were placed in this encounter.   Return if symptoms worsen or fail to improve.  Adina Crandall, NP

## 2024-03-27 NOTE — Assessment & Plan Note (Addendum)
 Ambiguous in nature, EKG wnl. Will check CBC, CMP, A1C, TSH. Query gallbladder involvement given stomach pain and then sometimes a BM after. CT renal did show a calcified gallstone. Ed precautions reviewed

## 2024-03-28 LAB — COMPREHENSIVE METABOLIC PANEL WITH GFR
AG Ratio: 1.8 (calc) (ref 1.0–2.5)
ALT: 17 U/L (ref 9–46)
AST: 21 U/L (ref 10–35)
Albumin: 4.3 g/dL (ref 3.6–5.1)
Alkaline phosphatase (APISO): 60 U/L (ref 35–144)
BUN/Creatinine Ratio: 23 (calc) — ABNORMAL HIGH (ref 6–22)
BUN: 27 mg/dL — ABNORMAL HIGH (ref 7–25)
CO2: 26 mmol/L (ref 20–32)
Calcium: 9.4 mg/dL (ref 8.6–10.3)
Chloride: 109 mmol/L (ref 98–110)
Creat: 1.18 mg/dL (ref 0.70–1.28)
Globulin: 2.4 g/dL (ref 1.9–3.7)
Glucose, Bld: 105 mg/dL — ABNORMAL HIGH (ref 65–99)
Potassium: 4.2 mmol/L (ref 3.5–5.3)
Sodium: 142 mmol/L (ref 135–146)
Total Bilirubin: 0.3 mg/dL (ref 0.2–1.2)
Total Protein: 6.7 g/dL (ref 6.1–8.1)
eGFR: 64 mL/min/1.73m2 (ref >=60)

## 2024-03-28 LAB — CBC
HCT: 38.1 % — ABNORMAL LOW (ref 38.5–50.0)
Hemoglobin: 12.4 g/dL — ABNORMAL LOW (ref 13.2–17.1)
MCH: 31.3 pg (ref 27.0–33.0)
MCHC: 32.5 g/dL (ref 32.0–36.0)
MCV: 96.2 fL (ref 80.0–100.0)
MPV: 10 fL (ref 7.5–12.5)
Platelets: 291 Thousand/uL (ref 140–400)
RBC: 3.96 Million/uL — ABNORMAL LOW (ref 4.20–5.80)
RDW: 12.2 % (ref 11.0–15.0)
WBC: 4.5 Thousand/uL (ref 3.8–10.8)

## 2024-03-28 LAB — TSH: TSH: 2.74 m[IU]/L (ref 0.40–4.50)

## 2024-03-28 LAB — HEMOGLOBIN A1C
Hgb A1c MFr Bld: 5.8 % — ABNORMAL HIGH (ref <5.7)
Mean Plasma Glucose: 120 mg/dL
eAG (mmol/L): 6.6 mmol/L

## 2024-03-30 ENCOUNTER — Encounter: Payer: Self-pay | Admitting: Nurse Practitioner

## 2024-03-31 DIAGNOSIS — Z961 Presence of intraocular lens: Secondary | ICD-10-CM | POA: Diagnosis not present

## 2024-03-31 DIAGNOSIS — H43813 Vitreous degeneration, bilateral: Secondary | ICD-10-CM | POA: Diagnosis not present

## 2024-04-01 ENCOUNTER — Ambulatory Visit: Payer: Self-pay | Admitting: Nurse Practitioner

## 2024-04-01 DIAGNOSIS — R7989 Other specified abnormal findings of blood chemistry: Secondary | ICD-10-CM

## 2024-04-06 ENCOUNTER — Other Ambulatory Visit (INDEPENDENT_AMBULATORY_CARE_PROVIDER_SITE_OTHER)

## 2024-04-06 DIAGNOSIS — R7989 Other specified abnormal findings of blood chemistry: Secondary | ICD-10-CM | POA: Diagnosis not present

## 2024-04-06 LAB — IBC + FERRITIN
Ferritin: 78.3 ng/mL (ref 22.0–322.0)
Iron: 71 ug/dL (ref 42–165)
Saturation Ratios: 26.8 % (ref 20.0–50.0)
TIBC: 264.6 ug/dL (ref 250.0–450.0)
Transferrin: 189 mg/dL — ABNORMAL LOW (ref 212.0–360.0)

## 2024-04-06 LAB — FOLATE: Folate: >23.4 ng/mL (ref >5.9)

## 2024-04-06 LAB — VITAMIN B12: Vitamin B-12: 540 pg/mL (ref 211–911)

## 2024-04-08 ENCOUNTER — Ambulatory Visit: Payer: Self-pay | Admitting: Nurse Practitioner

## 2024-05-04 ENCOUNTER — Other Ambulatory Visit: Payer: Self-pay | Admitting: Nurse Practitioner

## 2024-05-04 DIAGNOSIS — R0981 Nasal congestion: Secondary | ICD-10-CM

## 2024-06-09 ENCOUNTER — Encounter: Admitting: Nurse Practitioner

## 2024-06-24 ENCOUNTER — Ambulatory Visit: Admitting: Nurse Practitioner

## 2024-06-24 ENCOUNTER — Encounter: Payer: Self-pay | Admitting: Nurse Practitioner

## 2024-06-24 VITALS — BP 118/70 | HR 66 | Temp 97.6°F | Ht 68.0 in | Wt 171.8 lb

## 2024-06-24 DIAGNOSIS — Z87891 Personal history of nicotine dependence: Secondary | ICD-10-CM

## 2024-06-24 DIAGNOSIS — Z125 Encounter for screening for malignant neoplasm of prostate: Secondary | ICD-10-CM

## 2024-06-24 DIAGNOSIS — R350 Frequency of micturition: Secondary | ICD-10-CM | POA: Diagnosis not present

## 2024-06-24 DIAGNOSIS — I10 Essential (primary) hypertension: Secondary | ICD-10-CM | POA: Diagnosis not present

## 2024-06-24 DIAGNOSIS — E7800 Pure hypercholesterolemia, unspecified: Secondary | ICD-10-CM

## 2024-06-24 DIAGNOSIS — N401 Enlarged prostate with lower urinary tract symptoms: Secondary | ICD-10-CM

## 2024-06-24 DIAGNOSIS — R7989 Other specified abnormal findings of blood chemistry: Secondary | ICD-10-CM | POA: Diagnosis not present

## 2024-06-24 DIAGNOSIS — Z126 Encounter for screening for malignant neoplasm of bladder: Secondary | ICD-10-CM | POA: Diagnosis not present

## 2024-06-24 DIAGNOSIS — E039 Hypothyroidism, unspecified: Secondary | ICD-10-CM | POA: Diagnosis not present

## 2024-06-24 LAB — COMPREHENSIVE METABOLIC PANEL WITH GFR
ALT: 18 U/L (ref 0–53)
AST: 20 U/L (ref 0–37)
Albumin: 4.5 g/dL (ref 3.5–5.2)
Alkaline Phosphatase: 54 U/L (ref 39–117)
BUN: 18 mg/dL (ref 6–23)
CO2: 29 meq/L (ref 19–32)
Calcium: 9.5 mg/dL (ref 8.4–10.5)
Chloride: 104 meq/L (ref 96–112)
Creatinine, Ser: 0.98 mg/dL (ref 0.40–1.50)
GFR: 74.38 mL/min (ref >60.00)
Glucose, Bld: 89 mg/dL (ref 70–99)
Potassium: 4.5 meq/L (ref 3.5–5.1)
Sodium: 140 meq/L (ref 135–145)
Total Bilirubin: 0.6 mg/dL (ref 0.2–1.2)
Total Protein: 6.9 g/dL (ref 6.0–8.3)

## 2024-06-24 LAB — LIPID PANEL
Cholesterol: 166 mg/dL (ref 0–200)
HDL: 39.6 mg/dL (ref >39.00)
LDL Cholesterol: 108 mg/dL — ABNORMAL HIGH (ref 0–99)
NonHDL: 125.94
Total CHOL/HDL Ratio: 4
Triglycerides: 90 mg/dL (ref 0.0–149.0)
VLDL: 18 mg/dL (ref 0.0–40.0)

## 2024-06-24 LAB — MICROALBUMIN / CREATININE URINE RATIO
Creatinine,U: 42.3 mg/dL
Microalb Creat Ratio: UNDETERMINED mg/g (ref 0.0–30.0)
Microalb, Ur: <0.7 mg/dL

## 2024-06-24 LAB — CBC
HCT: 40.1 % (ref 39.0–52.0)
Hemoglobin: 13.4 g/dL (ref 13.0–17.0)
MCHC: 33.5 g/dL (ref 30.0–36.0)
MCV: 94 fl (ref 78.0–100.0)
Platelets: 273 K/uL (ref 150.0–400.0)
RBC: 4.27 Mil/uL (ref 4.22–5.81)
RDW: 13.3 % (ref 11.5–15.5)
WBC: 4.1 K/uL (ref 4.0–10.5)

## 2024-06-24 LAB — PSA, MEDICARE: PSA: 1.44 ng/mL (ref 0.10–4.00)

## 2024-06-24 LAB — URINALYSIS, MICROSCOPIC ONLY

## 2024-06-24 NOTE — Assessment & Plan Note (Signed)
 History of the same currently maintained on levothyroxine  50 mcg daily.  Last TSH within normal limits continue medication as prescribed.

## 2024-06-24 NOTE — Assessment & Plan Note (Signed)
 Repeat CBC consider iFOB

## 2024-06-24 NOTE — Patient Instructions (Signed)
Nice to see you today I will be in touch with the labs once I have them Follow up with me in 6 months, sooner if you need me 

## 2024-06-24 NOTE — Assessment & Plan Note (Signed)
 History same pending PSA.  Patient on Flomax  0.4 mg nightly with good result.  Continue

## 2024-06-24 NOTE — Assessment & Plan Note (Signed)
 Pending urine microscopy rule out microscopic hematuria

## 2024-06-24 NOTE — Assessment & Plan Note (Signed)
History of the same.  Pending lipid panel 

## 2024-06-24 NOTE — Progress Notes (Signed)
 Established Patient Office Visit  Subjective   Patient ID: Joseph Whitaker, male    DOB: [birth year]  Age: 78 y.o. MRN: [id number]  Chief Complaint  Patient presents with   Annual Exam    Declines vaccines     HPI  HTN: Patient currently maintained on amlodipine  5 mg daily. Does well and check at home if he needs it   Hypothyroidism: Currently maintained on levothyroxine  50 mcg daily  BPH: Currently maintained on tamsulosin  0.4 mg daily. States that he will get up 1-2 times a night to urinate  Insomnia: Currently maintained on trazodone  25 to 50 mg nightly as needed.  Patient has not been using the medication  for complete physical and follow up of chronic conditions.  Immunizations: -Tetanus: Completed in 2010? -Influenza: Refused -Shingles refused -Pneumonia refused  Diet: Fair diet. He is eating 3 meals a day and no snacks. He is drinking coffee and water   Exercise: No regular exercise. 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   Dental exam: Completes semi-annually    Colonoscopy: Completed in 11/23/2015, aged out Lung Cancer Screening: Does not qualify  PSA: Due  Sleep: going to bed around 10 and getting up around 530-6. Sometimes he feels rested. Depends on the activity the day before.   Advanced directive: does not have one        Review of Systems  Constitutional:  Negative for chills and fever.  Respiratory:  Negative for shortness of breath and wheezing.   Cardiovascular:  Negative for chest pain, palpitations and leg swelling.  Gastrointestinal:  Negative for abdominal pain, blood in stool, constipation, diarrhea, nausea and vomiting.       BM daily   Genitourinary:  Negative for dysuria, frequency, hematuria and urgency.  Skin:  Negative for rash.  Neurological:  Negative for dizziness, tingling and headaches.  Psychiatric/Behavioral:  Negative for hallucinations and suicidal ideas.       Objective:     BP 118/70    Pulse 66   Temp 97.6 F (36.4 C) (Oral)   Ht 5' 8 (1.727 m)   Wt 171 lb 12.8 oz (77.9 kg)   SpO2 96%   BMI 26.12 kg/m  BP Readings from Last 3 Encounters:  06/24/24 118/70  03/27/24 118/62  12/06/23 (!) 142/80   Wt Readings from Last 3 Encounters:  06/24/24 171 lb 12.8 oz (77.9 kg)  03/27/24 175 lb 6.4 oz (79.6 kg)  12/06/23 176 lb 12.8 oz (80.2 kg)   SpO2 Readings from Last 3 Encounters:  06/24/24 96%  03/27/24 94%  12/06/23 97%      Physical Exam Vitals and nursing note reviewed.  Constitutional:      Appearance: Normal appearance.  HENT:     Right Ear: Tympanic membrane, ear canal and external ear normal.     Left Ear: Tympanic membrane, ear canal and external ear normal.     Mouth/Throat:     Mouth: Mucous membranes are moist.     Pharynx: Oropharynx is clear.  Eyes:     Extraocular Movements: Extraocular movements intact.     Pupils: Pupils are equal, round, and reactive to light.  Cardiovascular:     Rate and Rhythm: Normal rate and regular rhythm.     Pulses: Normal pulses.     Heart sounds: Normal heart sounds.  Pulmonary:     Effort: Pulmonary effort is normal.     Breath sounds: Normal breath sounds.  Abdominal:  General: Bowel sounds are normal. There is no distension.     Palpations: There is no mass.     Tenderness: There is no abdominal tenderness.     Hernia: No hernia is present.  Musculoskeletal:     Right lower leg: No edema.     Left lower leg: No edema.  Lymphadenopathy:     Cervical: No cervical adenopathy.  Skin:    General: Skin is warm.  Neurological:     General: No focal deficit present.     Mental Status: He is alert.     Deep Tendon Reflexes:     Reflex Scores:      Bicep reflexes are 2+ on the right side and 2+ on the left side.      Patellar reflexes are 2+ on the right side and 2+ on the left side.    Comments: Bilateral upper and lower extremity strength 5/5  Psychiatric:        Mood and Affect: Mood normal.         Behavior: Behavior normal.        Thought Content: Thought content normal.        Judgment: Judgment normal.      No results found for any visits on 06/24/24.    The 10-year ASCVD risk score (Arnett DK, et al., 2019) is: 27.9%    Assessment & Plan:   Problem List Items Addressed This Visit       Cardiovascular and Mediastinum   Essential (primary) hypertension   Patient currently maintained on amlodipine  5 mg daily.  Blood pressure controlled.  Patient is tolerating medication well.  Continue medication as prescribed      Relevant Orders   CBC   Comprehensive metabolic panel with GFR   Microalbumin / creatinine urine ratio   Lipid panel     Endocrine   Hypothyroidism   History of the same currently maintained on levothyroxine  50 mcg daily.  Last TSH within normal limits continue medication as prescribed.        Other   Benign prostatic hyperplasia with urinary frequency   History same pending PSA.  Patient on Flomax  0.4 mg nightly with good result.  Continue      Relevant Orders   PSA, Medicare   Abnormal CBC - Primary   Repeat CBC consider iFOB      Relevant Orders   CBC   Former tobacco use   Pending urine microscopy rule out microscopic hematuria      Relevant Orders   Urine Microscopic   Elevated LDL cholesterol level   History of the same.  Pending lipid panel      Relevant Orders   Lipid panel   Other Visit Diagnoses       Screening for bladder cancer       Relevant Orders   Urine Microscopic     Screening for prostate cancer       Relevant Orders   PSA, Medicare       Return in about 6 months (around 12/23/2024) for BP recheck/weight .    Adina Crandall, NP

## 2024-06-24 NOTE — Assessment & Plan Note (Signed)
 Patient currently maintained on amlodipine  5 mg daily.  Blood pressure controlled.  Patient is tolerating medication well.  Continue medication as prescribed

## 2024-06-25 ENCOUNTER — Ambulatory Visit: Payer: Self-pay | Admitting: Nurse Practitioner

## 2024-09-16 ENCOUNTER — Other Ambulatory Visit: Payer: Self-pay | Admitting: Nurse Practitioner

## 2024-09-16 DIAGNOSIS — N401 Enlarged prostate with lower urinary tract symptoms: Secondary | ICD-10-CM

## 2024-10-01 ENCOUNTER — Ambulatory Visit: Payer: Medicare Other

## 2024-10-02 ENCOUNTER — Ambulatory Visit

## 2024-12-03 ENCOUNTER — Ambulatory Visit

## 2024-12-23 ENCOUNTER — Ambulatory Visit: Admitting: Nurse Practitioner
